# Patient Record
Sex: Female | Born: 1980
Health system: Southern US, Community
[De-identification: ages and names within clinical notes are randomized; demographics above are authoritative.]

## PROBLEM LIST (undated history)

## (undated) ENCOUNTER — Inpatient Hospital Stay (HOSPITAL_COMMUNITY): Payer: Self-pay

## (undated) DIAGNOSIS — R51 Headache: Secondary | ICD-10-CM

## (undated) DIAGNOSIS — Z8619 Personal history of other infectious and parasitic diseases: Secondary | ICD-10-CM

## (undated) DIAGNOSIS — Z9119 Patient's noncompliance with other medical treatment and regimen: Secondary | ICD-10-CM

## (undated) DIAGNOSIS — O09899 Supervision of other high risk pregnancies, unspecified trimester: Secondary | ICD-10-CM

## (undated) HISTORY — DX: Personal history of other infectious and parasitic diseases: Z86.19

## (undated) HISTORY — PX: COLPOSCOPY: SHX161

## (undated) HISTORY — PX: BREAST SURGERY: SHX581

## (undated) HISTORY — PX: EYE SURGERY: SHX253

## (undated) HISTORY — DX: Headache: R51

## (undated) HISTORY — PX: AUGMENTATION MAMMAPLASTY: SUR837

---

## 2007-09-23 ENCOUNTER — Emergency Department (HOSPITAL_COMMUNITY): Admission: EM | Admit: 2007-09-23 | Discharge: 2007-09-23 | Payer: Self-pay | Admitting: Family Medicine

## 2007-10-29 ENCOUNTER — Other Ambulatory Visit: Admission: RE | Admit: 2007-10-29 | Discharge: 2007-10-29 | Payer: Self-pay | Admitting: Family Medicine

## 2008-06-07 ENCOUNTER — Emergency Department (HOSPITAL_COMMUNITY): Admission: EM | Admit: 2008-06-07 | Discharge: 2008-06-07 | Payer: Self-pay | Admitting: Family Medicine

## 2008-11-12 ENCOUNTER — Other Ambulatory Visit: Admission: RE | Admit: 2008-11-12 | Discharge: 2008-11-12 | Payer: Self-pay | Admitting: Family Medicine

## 2009-06-20 ENCOUNTER — Emergency Department (HOSPITAL_COMMUNITY): Admission: EM | Admit: 2009-06-20 | Discharge: 2009-06-20 | Payer: Self-pay | Admitting: Family Medicine

## 2009-11-18 ENCOUNTER — Other Ambulatory Visit: Admission: RE | Admit: 2009-11-18 | Discharge: 2009-11-18 | Payer: Self-pay | Admitting: Family Medicine

## 2010-02-11 ENCOUNTER — Emergency Department (HOSPITAL_COMMUNITY): Admission: EM | Admit: 2010-02-11 | Discharge: 2010-02-11 | Payer: Self-pay | Admitting: Emergency Medicine

## 2010-08-08 NOTE — L&D Delivery Note (Signed)
Delivery Note Pt progressed rapidly to complete dilation and pushed 3 times.  At 12:34 PM a healthy female was delivered via  (Presentation: LOA  ).  APGAR: 9, 9; weight 6 lb 9.3 oz (2985 g).   Placenta status: delivered spontaneously.  No issues.  Anesthesia:  Epidural Episiotomy: n/a Lacerations: small second degree Suture Repair: 3.0 vicryl rapide Est. Blood Loss (mL): 300cc  Mom to postpartum.  Baby to nursery-stable  BP remains WNL at 119/80.Marland Kitchen  Oliver Pila 07/26/2011, 12:54 PM

## 2011-01-19 LAB — ANTIBODY SCREEN: Antibody Screen: NEGATIVE

## 2011-01-19 LAB — HIV ANTIBODY (ROUTINE TESTING W REFLEX): HIV: NONREACTIVE

## 2011-01-19 LAB — GC/CHLAMYDIA PROBE AMP, GENITAL
Chlamydia: NEGATIVE
Gonorrhea: NEGATIVE

## 2011-01-19 LAB — RUBELLA ANTIBODY, IGM: Rubella: IMMUNE

## 2011-01-19 LAB — RPR: RPR: NONREACTIVE

## 2011-07-02 ENCOUNTER — Emergency Department
Admission: EM | Admit: 2011-07-02 | Discharge: 2011-07-02 | Disposition: A | Discharge status: Home / Self Care | Payer: 59 | Source: Home / Self Care | Attending: Family Medicine | Admitting: Family Medicine

## 2011-07-02 DIAGNOSIS — J111 Influenza due to unidentified influenza virus with other respiratory manifestations: Secondary | ICD-10-CM

## 2011-07-02 DIAGNOSIS — J101 Influenza due to other identified influenza virus with other respiratory manifestations: Secondary | ICD-10-CM

## 2011-07-02 HISTORY — DX: Patient's noncompliance with other medical treatment and regimen: Z91.19

## 2011-07-02 HISTORY — DX: Supervision of other high risk pregnancies, unspecified trimester: O09.899

## 2011-07-02 MED ORDER — OSELTAMIVIR PHOSPHATE 75 MG PO CAPS: 75.0000 mg | ORAL_CAPSULE | Freq: Two times a day (BID) | ORAL | Status: AC

## 2011-07-02 NOTE — ED Notes (Signed)
States symptoms started yesterday w/out fever- dry cough- headache, bodyaches and sore throat,

## 2011-07-05 NOTE — ED Provider Notes (Signed)
History     CSN: [id number] Arrival date & time: 07/02/2011 11:14 AM   First MD Initiated Contact with Patient 07/02/11 1141      Chief Complaint  Patient presents with  . URI     HPI Comments: Patient is [redacted] weeks pregnant and yesterday developed relatively mild flu-like symptoms including sore throat, a non-productive cough, myalgias, headache, nasal congestion, and today had chills.  She has had a flu shot.  No abdominal/pelvic pain or vaginal bleeding.  Patient is a 30 y.o. female presenting with URI. The history is provided by the patient and the spouse.  URI The primary symptoms include fatigue, headaches, sore throat, cough and myalgias. Primary symptoms do not include fever, ear pain, swollen glands, wheezing, abdominal pain, nausea, vomiting, arthralgias or rash. The current episode started yesterday. This is a new problem.  Symptoms associated with the illness include chills and congestion. The illness is not associated with plugged ear sensation, facial pain, sinus pressure or rhinorrhea.    Past Medical History  Diagnosis Date  . Pregnant patient     35wk    History reviewed. No pertinent past surgical history.  History reviewed. No pertinent family history.  History  Substance Use Topics  . Smoking status: Never Smoker   . Smokeless tobacco: Not on file  . Alcohol Use: No    OB History    Grav Para Term Preterm Abortions TAB SAB Ect Mult Living   1               Review of Systems  Constitutional: Positive for chills and fatigue. Negative for fever.  HENT: Positive for congestion and sore throat. Negative for ear pain, rhinorrhea, sinus pressure and ear discharge.   Eyes: Negative.   Respiratory: Positive for cough. Negative for shortness of breath and wheezing.   Cardiovascular: Negative.   Gastrointestinal: Negative.  Negative for nausea, vomiting and abdominal pain.  Genitourinary: Negative.   Musculoskeletal: Positive for myalgias. Negative for  arthralgias.  Skin: Negative.  Negative for rash.  Neurological: Positive for headaches.    Allergies  Review of patient's allergies indicates no known allergies.  Home Medications   Current Outpatient Rx  Name Route Sig Dispense Refill  . OMEGA-3 FATTY ACIDS 1000 MG PO CAPS Oral Take 2 g by mouth daily.      Marland Kitchen PRENATE ELITE 90-600-400 MG-MCG-MCG PO TABS Oral Take 1 tablet by mouth daily.      . OSELTAMIVIR PHOSPHATE 75 MG PO CAPS Oral Take 1 capsule (75 mg total) by mouth every 12 (twelve) hours. 10 capsule 0    BP 144/92  Pulse 95  Temp(Src) 98.5 F (36.9 C) (Oral)  Resp 18  Ht 5\' 2"  (1.575 m)  Wt 143 lb (64.864 kg)  BMI 26.15 kg/m2  SpO2 98%  Physical Exam  Constitutional: She is oriented to person, place, and time. She appears well-developed and well-nourished. No distress.  HENT:  Head: Normocephalic.  Right Ear: External ear normal.  Left Ear: External ear normal.  Nose: Mucosal edema and rhinorrhea present. No sinus tenderness.  Mouth/Throat: Oropharynx is clear and moist. No oropharyngeal exudate.  Eyes: Conjunctivae and EOM are normal. Pupils are equal, round, and reactive to light. Right eye exhibits no discharge. Left eye exhibits no discharge.  Neck: Normal range of motion. Neck supple.  Cardiovascular: Normal rate, regular rhythm and normal heart sounds.   Pulmonary/Chest: Effort normal and breath sounds normal. No respiratory distress. She has no wheezes. She has no  rales. She exhibits tenderness.       Chest:  There is mild tenderness to palpation over the mid-sternum.   Abdominal: Soft. Bowel sounds are normal. There is no tenderness.       Abdomen is gravid.  Musculoskeletal: She exhibits no edema and no tenderness.  Lymphadenopathy:    She has cervical adenopathy.       Right cervical: Posterior cervical adenopathy present.       Left cervical: Posterior cervical adenopathy present.  Neurological: She is alert and oriented to person, place, and time.    Skin: Skin is warm and dry. She is not diaphoretic.    ED Course  Procedures none   Labs Reviewed  POCT INFLUENZA A/B positive influenza A      1. Influenza A       MDM  Because of significant morbidity and mortality from influenza in pregnant women, patient and husband prefer to begin a course of Tamiflu.  Although Tamiflu is a category C drug for use in pregnancy, there have been no reports of adverse effects during pregnancy. Rest, increase fluid intake Followup with OB as soon as possible. Return (or proceed to ER) for worsening symptoms:  shortness of breath, increasing abdominal pain, etc.     Donna Christen, MD 07/05/11 [phone]

## 2011-07-22 ENCOUNTER — Encounter (HOSPITAL_COMMUNITY): Payer: Self-pay | Admitting: *Deleted

## 2011-07-22 ENCOUNTER — Telehealth (HOSPITAL_COMMUNITY): Payer: Self-pay | Admitting: *Deleted

## 2011-07-22 NOTE — Telephone Encounter (Signed)
Preadmission screen  

## 2011-07-25 ENCOUNTER — Encounter (HOSPITAL_COMMUNITY): Payer: Self-pay | Admitting: *Deleted

## 2011-07-25 ENCOUNTER — Other Ambulatory Visit: Payer: Self-pay | Admitting: Obstetrics and Gynecology

## 2011-07-25 ENCOUNTER — Telehealth (HOSPITAL_COMMUNITY): Payer: Self-pay | Admitting: *Deleted

## 2011-07-25 NOTE — Telephone Encounter (Signed)
Preadmission screen  

## 2011-07-26 ENCOUNTER — Inpatient Hospital Stay (HOSPITAL_COMMUNITY): Payer: Commercial Managed Care - PPO | Admitting: Anesthesiology

## 2011-07-26 ENCOUNTER — Encounter (HOSPITAL_COMMUNITY): Payer: Self-pay | Admitting: Anesthesiology

## 2011-07-26 ENCOUNTER — Encounter (HOSPITAL_COMMUNITY): Payer: Self-pay

## 2011-07-26 ENCOUNTER — Inpatient Hospital Stay (HOSPITAL_COMMUNITY)
Admission: RE | Admit: 2011-07-26 | Discharge: 2011-07-28 | DRG: 775 | Disposition: A | Discharge status: Home / Self Care | Payer: Commercial Managed Care - PPO | Source: Ambulatory Visit | Attending: Obstetrics and Gynecology | Admitting: Obstetrics and Gynecology

## 2011-07-26 DIAGNOSIS — O26839 Pregnancy related renal disease, unspecified trimester: Principal | ICD-10-CM | POA: Diagnosis present

## 2011-07-26 LAB — COMPREHENSIVE METABOLIC PANEL
ALT: 10 U/L (ref 0–35)
Calcium: 9.4 mg/dL (ref 8.4–10.5)
Creatinine, Ser: 0.69 mg/dL (ref 0.50–1.10)
GFR calc Af Amer: >90 mL/min (ref >90)
Glucose, Bld: 102 mg/dL — ABNORMAL HIGH (ref 70–99)
Sodium: 135 mEq/L (ref 135–145)
Total Protein: 6.3 g/dL (ref 6.0–8.3)

## 2011-07-26 LAB — ABO/RH: ABO/RH(D): O POS

## 2011-07-26 LAB — CBC
Hemoglobin: 12 g/dL (ref 12.0–15.0)
MCH: 29.8 pg (ref 26.0–34.0)
MCHC: 32.7 g/dL (ref 30.0–36.0)
MCV: 91.1 fL (ref 78.0–100.0)

## 2011-07-26 LAB — RPR: RPR Ser Ql: NONREACTIVE

## 2011-07-26 LAB — URIC ACID: Uric Acid, Serum: 5.4 mg/dL (ref 2.4–7.0)

## 2011-07-26 MED ORDER — FLEET ENEMA 7-19 GM/118ML RE ENEM: 1.0000 | ENEMA | RECTAL | Status: DC | PRN

## 2011-07-26 MED ORDER — SIMETHICONE 80 MG PO CHEW: 80.0000 mg | CHEWABLE_TABLET | ORAL | Status: DC | PRN

## 2011-07-26 MED ORDER — ACETAMINOPHEN 325 MG PO TABS: 650.0000 mg | ORAL_TABLET | ORAL | Status: DC | PRN

## 2011-07-26 MED ORDER — BENZOCAINE-MENTHOL 20-0.5 % EX AERO
1.0000 "application " | INHALATION_SPRAY | CUTANEOUS | Status: DC | PRN
  Administered 2011-07-26: 1 via TOPICAL

## 2011-07-26 MED ORDER — LACTATED RINGERS IV SOLN: 500.0000 mL | INTRAVENOUS | Status: DC | PRN

## 2011-07-26 MED ORDER — CITRIC ACID-SODIUM CITRATE 334-500 MG/5ML PO SOLN: 30.0000 mL | ORAL | Status: DC | PRN

## 2011-07-26 MED ORDER — LIDOCAINE HCL 1.5 % IJ SOLN
INTRAMUSCULAR | Status: DC | PRN
  Administered 2011-07-26: 5 mL via INTRADERMAL
  Administered 2011-07-26: 2 mL via INTRADERMAL
  Administered 2011-07-26: 5 mL via INTRADERMAL

## 2011-07-26 MED ORDER — PRENATAL MULTIVITAMIN CH
1.0000 | ORAL_TABLET | Freq: Every day | ORAL | Status: DC
  Administered 2011-07-27 – 2011-07-28 (×2): 1 via ORAL
  Filled 2011-07-26 (×2): qty 1

## 2011-07-26 MED ORDER — DIBUCAINE 1 % RE OINT: 1.0000 "application " | TOPICAL_OINTMENT | RECTAL | Status: DC | PRN

## 2011-07-26 MED ORDER — OXYTOCIN 20 UNITS IN LACTATED RINGERS INFUSION - SIMPLE
125.0000 mL/h | Freq: Once | INTRAVENOUS | Status: AC
  Administered 2011-07-26: 125 mL/h via INTRAVENOUS

## 2011-07-26 MED ORDER — LACTATED RINGERS IV SOLN
500.0000 mL | Freq: Once | INTRAVENOUS | Status: AC
  Administered 2011-07-26: 1000 mL via INTRAVENOUS

## 2011-07-26 MED ORDER — LANOLIN HYDROUS EX OINT: TOPICAL_OINTMENT | CUTANEOUS | Status: DC | PRN

## 2011-07-26 MED ORDER — BENZOCAINE-MENTHOL 20-0.5 % EX AERO
INHALATION_SPRAY | CUTANEOUS | Status: AC
  Filled 2011-07-26: qty 56

## 2011-07-26 MED ORDER — PHENYLEPHRINE 40 MCG/ML (10ML) SYRINGE FOR IV PUSH (FOR BLOOD PRESSURE SUPPORT): 80.0000 ug | PREFILLED_SYRINGE | INTRAVENOUS | Status: DC | PRN

## 2011-07-26 MED ORDER — IBUPROFEN 600 MG PO TABS
600.0000 mg | ORAL_TABLET | Freq: Four times a day (QID) | ORAL | Status: DC | PRN
  Administered 2011-07-26: 600 mg via ORAL
  Filled 2011-07-26: qty 1

## 2011-07-26 MED ORDER — DIPHENHYDRAMINE HCL 25 MG PO CAPS: 25.0000 mg | ORAL_CAPSULE | Freq: Four times a day (QID) | ORAL | Status: DC | PRN

## 2011-07-26 MED ORDER — ONDANSETRON HCL 4 MG/2ML IJ SOLN: 4.0000 mg | Freq: Four times a day (QID) | INTRAMUSCULAR | Status: DC | PRN

## 2011-07-26 MED ORDER — DIPHENHYDRAMINE HCL 50 MG/ML IJ SOLN: 12.5000 mg | INTRAMUSCULAR | Status: DC | PRN

## 2011-07-26 MED ORDER — ONDANSETRON HCL 4 MG PO TABS: 4.0000 mg | ORAL_TABLET | ORAL | Status: DC | PRN

## 2011-07-26 MED ORDER — OXYCODONE-ACETAMINOPHEN 5-325 MG PO TABS
1.0000 | ORAL_TABLET | ORAL | Status: DC | PRN
  Administered 2011-07-27 (×2): 1 via ORAL
  Filled 2011-07-26 (×2): qty 1

## 2011-07-26 MED ORDER — LIDOCAINE HCL (PF) 1 % IJ SOLN
30.0000 mL | INTRAMUSCULAR | Status: DC | PRN
  Filled 2011-07-26: qty 30

## 2011-07-26 MED ORDER — ONDANSETRON HCL 4 MG/2ML IJ SOLN
4.0000 mg | INTRAMUSCULAR | Status: DC | PRN
  Filled 2011-07-26: qty 2

## 2011-07-26 MED ORDER — OXYTOCIN BOLUS FROM INFUSION
500.0000 mL | Freq: Once | INTRAVENOUS | Status: DC
  Filled 2011-07-26: qty 500

## 2011-07-26 MED ORDER — FENTANYL 2.5 MCG/ML BUPIVACAINE 1/10 % EPIDURAL INFUSION (WH - ANES)
14.0000 mL/h | INTRAMUSCULAR | Status: DC
  Administered 2011-07-26: 14 mL/h via EPIDURAL
  Filled 2011-07-26: qty 60

## 2011-07-26 MED ORDER — SENNOSIDES-DOCUSATE SODIUM 8.6-50 MG PO TABS
2.0000 | ORAL_TABLET | Freq: Every day | ORAL | Status: DC
  Administered 2011-07-27 (×2): 2 via ORAL

## 2011-07-26 MED ORDER — LACTATED RINGERS IV SOLN
INTRAVENOUS | Status: DC
  Administered 2011-07-26: 08:00:00 via INTRAVENOUS

## 2011-07-26 MED ORDER — IBUPROFEN 600 MG PO TABS
600.0000 mg | ORAL_TABLET | Freq: Four times a day (QID) | ORAL | Status: DC
  Administered 2011-07-26 – 2011-07-28 (×7): 600 mg via ORAL
  Filled 2011-07-26 (×7): qty 1

## 2011-07-26 MED ORDER — OXYTOCIN 20 UNITS IN LACTATED RINGERS INFUSION - SIMPLE
1.0000 m[IU]/min | INTRAVENOUS | Status: DC
  Administered 2011-07-26: 2 m[IU]/min via INTRAVENOUS
  Filled 2011-07-26: qty 1000

## 2011-07-26 MED ORDER — EPHEDRINE 5 MG/ML INJ: 10.0000 mg | INTRAVENOUS | Status: DC | PRN

## 2011-07-26 MED ORDER — ZOLPIDEM TARTRATE 5 MG PO TABS: 5.0000 mg | ORAL_TABLET | Freq: Every evening | ORAL | Status: DC | PRN

## 2011-07-26 MED ORDER — WITCH HAZEL-GLYCERIN EX PADS: 1.0000 "application " | MEDICATED_PAD | CUTANEOUS | Status: DC | PRN

## 2011-07-26 MED ORDER — TERBUTALINE SULFATE 1 MG/ML IJ SOLN
0.2500 mg | Freq: Once | INTRAMUSCULAR | Status: DC | PRN
Start: 2011-07-26 — End: 2011-07-26

## 2011-07-26 MED ORDER — EPHEDRINE 5 MG/ML INJ
10.0000 mg | INTRAVENOUS | Status: DC | PRN
  Filled 2011-07-26: qty 4

## 2011-07-26 MED ORDER — PHENYLEPHRINE 40 MCG/ML (10ML) SYRINGE FOR IV PUSH (FOR BLOOD PRESSURE SUPPORT)
80.0000 ug | PREFILLED_SYRINGE | INTRAVENOUS | Status: DC | PRN
  Filled 2011-07-26: qty 5

## 2011-07-26 MED ORDER — TETANUS-DIPHTH-ACELL PERTUSSIS 5-2.5-18.5 LF-MCG/0.5 IM SUSP: 0.5000 mL | Freq: Once | INTRAMUSCULAR | Status: DC

## 2011-07-26 MED ORDER — OXYCODONE-ACETAMINOPHEN 5-325 MG PO TABS: 2.0000 | ORAL_TABLET | ORAL | Status: DC | PRN

## 2011-07-26 NOTE — Anesthesia Preprocedure Evaluation (Signed)
Anesthesia Evaluation  Patient identified by MRN, date of birth, ID band Patient awake    Reviewed: Allergy & Precautions, H&P , NPO status , Patient's Chart, lab work & pertinent test results, reviewed documented beta blocker date and time   History of Anesthesia Complications Negative for: history of anesthetic complications  Airway Mallampati: I TM Distance: >3 FB Neck ROM: full    Dental  (+) Teeth Intact   Pulmonary neg pulmonary ROS,  clear to auscultation        Cardiovascular neg cardio ROS regular Normal    Neuro/Psych  Headaches (migraines - last 1 week ago), Negative Psych ROS   GI/Hepatic negative GI ROS, Neg liver ROS,   Endo/Other  Negative Endocrine ROS  Renal/GU negative Renal ROS  Genitourinary negative   Musculoskeletal   Abdominal   Peds  Hematology negative hematology ROS (+)   Anesthesia Other Findings   Reproductive/Obstetrics (+) Pregnancy                           Anesthesia Physical Anesthesia Plan  ASA: II  Anesthesia Plan: Epidural   Post-op Pain Management:    Induction:   Airway Management Planned:   Additional Equipment:   Intra-op Plan:   Post-operative Plan:   Informed Consent: I have reviewed the patients History and Physical, chart, labs and discussed the procedure including the risks, benefits and alternatives for the proposed anesthesia with the patient or authorized representative who has indicated his/her understanding and acceptance.     Plan Discussed with:   Anesthesia Plan Comments:         Anesthesia Quick Evaluation

## 2011-07-26 NOTE — H&P (Addendum)
Kylie Lee is a 30 y.o. female G2P0010 at 38+ weeks gestation (EDD 08/12/11 by LMP c/w [place] at 6 weeks) presenting for induction given increasing proteinuria and some borderline BP of 120-140/80-90.  Pt first developed significant proteinuria at 32 weks with a 24 hour urine showing greater than 1000mg /24 hours.  At that point her BP and PIH labs were completely WNL, so seemed to be neprotic syndrome of unknown etiology.  She has been followed extremely closely wqith NST's, labs and a repeat 24 hour urine last week showed proteinuria increased to about 1900mg .  She remains asymptomatic but some BP are 130/90 and given the difficulty determining if preeclampsia vs her nephrotic syndrome, we are delivering now that she is term.  Prenatal care has been otherwise uneventful prior to the proteinuria.  History OB History    Grav Para Term Preterm Abortions TAB SAB Ect Mult Living   2 0 0  1  1       SAB x 1   2011  PMH Proteinuria with pregnancy  Past Surgical History  Procedure Date  . Eye surgery     lasik  . Breast surgery     aug   Family History: family history includes Arthritis in her paternal grandmother; Cancer in her paternal grandfather; Hypothyroidism in her father; Migraines in her mother; and Thyroid disease in her father and maternal grandmother. Social History:  reports that she has never smoked. She has never used smokeless tobacco. She reports that she does not drink alcohol or use illicit drugs.  Review of Systems  Eyes: Negative for blurred vision.  Neurological: Negative for headaches.    Dilation: 3.5 Effacement (%): 80 Station: -2 Exam by:: Dr Senaida Ores AROM clear  Blood pressure 140/81, pulse 74, temperature 97.5 F (36.4 C), temperature source Oral, resp. rate 18, height 5\' 2"  (1.575 m), weight 64.864 kg (143 lb), last menstrual period 11/01/2010. Maternal Exam:  Uterine Assessment: Contraction strength is mild.  Contraction frequency is irregular.    Abdomen: Fetal presentation: vertex  Introitus: Normal vulva. Normal vagina.    Fetal Exam Fetal Monitor Review: Variability: moderate (6-25 bpm).   Pattern: accelerations present.    Fetal State Assessment: Category I - tracings are normal.     Physical Exam  Constitutional: She is oriented to person, place, and time. She appears well-developed and well-nourished.  Cardiovascular: Normal rate and regular rhythm.   Respiratory: Effort normal and breath sounds normal.  GI: Soft. Bowel sounds are normal.  Genitourinary: Vagina normal and uterus normal.  Neurological: She is alert and oriented to person, place, and time.  Psychiatric: She has a normal mood and affect. Her behavior is normal.    Prenatal labs: ABO, Rh: O/Positive/-- (06/13 0000) Antibody: Negative (06/13 0000) Rubella: Immune (06/13 0000) RPR: Nonreactive (06/13 0000)  HBsAg: Negative (06/13 0000)  HIV: Non-reactive (06/13 0000)  GBS: Negative (11/30 0000)  One hour glucola 115 Declined genetics   Assessment/Plan: Pt extremely favorable, plans epidural.  On pitocin. PIH labs remain WNL and BP acceptable at 140/80's.  Do not plan magnesium unless BP worsen.  Oliver Pila 07/26/2011, 9:06 AM

## 2011-07-26 NOTE — Anesthesia Procedure Notes (Signed)
Epidural Patient location during procedure: OB Start time: 07/26/2011 9:16 AM Reason for block: procedure for pain  Staffing Performed by: anesthesiologist   Preanesthetic Checklist Completed: patient identified, site marked, surgical consent, pre-op evaluation, timeout performed, IV checked, risks and benefits discussed and monitors and equipment checked  Epidural Patient position: sitting Prep: site prepped and draped and DuraPrep Patient monitoring: continuous pulse ox and blood pressure Approach: midline Injection technique: LOR air  Needle:  Needle type: Tuohy  Needle gauge: 17 G Needle length: 9 cm Catheter type: closed end flexible Catheter size: 19 Gauge Test dose: negative  Assessment Events: blood not aspirated, injection not painful, no injection resistance, negative IV test and no paresthesia  Additional Notes Discussed risk of headache, infection, bleeding, nerve injury and failed or incomplete block.  Patient voices understanding and wishes to proceed.

## 2011-07-26 NOTE — Progress Notes (Signed)
Delivery of live viable female by Dr Senaida Ores

## 2011-07-27 LAB — CBC
MCH: 29.6 pg (ref 26.0–34.0)
MCHC: 32.8 g/dL (ref 30.0–36.0)
Platelets: 188 10*3/uL (ref 150–400)
RBC: 3.04 MIL/uL — ABNORMAL LOW (ref 3.87–5.11)
RDW: 13.7 % (ref 11.5–15.5)

## 2011-07-27 NOTE — Progress Notes (Addendum)
Post Partum Day 1 Subjective: no complaints, up ad lib and tolerating PO  Objective: Blood pressure 107/73, pulse 76, temperature 97.9 F (36.6 C), temperature source Oral, resp. rate 18, height 5\' 2"  (1.575 m), weight 64.864 kg (143 lb), last menstrual period 11/01/2010, unknown if currently breastfeeding.  Physical Exam:  General: alert Lochia: appropriate Uterine Fundus: firm    Basename 07/27/11 0515 07/26/11 0730  HGB 9.0* 12.0  HCT 27.4* 36.7    Assessment/Plan: Plan for discharge tomorrow Circumcisiond/w pt in detail and she desires to proceed   LOS: 1 day   Breniyah Romm W 07/27/2011, 10:14 AM

## 2011-07-27 NOTE — Anesthesia Postprocedure Evaluation (Signed)
  Anesthesia Post-op Note  Patient: Kylie Lee  Procedure(s) Performed: * No procedures listed *  Patient Location: [facility]  Anesthesia Type: Epidural  Level of Consciousness: alert  and oriented  Airway and Oxygen Therapy: Patient Spontanous Breathing  Post-op Pain: mild  Post-op Assessment: Patient's Cardiovascular Status Stable and Respiratory Function Stable  Post-op Vital Signs: stable  Complications: No apparent anesthesia complications

## 2011-07-28 MED ORDER — IBUPROFEN 600 MG PO TABS: 600.0000 mg | ORAL_TABLET | Freq: Four times a day (QID) | ORAL | Status: AC

## 2011-07-28 NOTE — Progress Notes (Signed)
Post Partum Day 2 Subjective: no complaints, up ad lib and tolerating PO  Objective: Blood pressure 121/78, pulse 97, temperature 98.6 F (37 C), temperature source Oral, resp. rate 18, height 5\' 2"  (1.575 m), weight 64.864 kg (143 lb), last menstrual period 11/01/2010, unknown if currently breastfeeding.  Physical Exam:  General: alert Lochia: appropriate Uterine Fundus: firm   Basename 07/27/11 0515 07/26/11 0730  HGB 9.0* 12.0  HCT 27.4* 36.7    Assessment/Plan: Discharge home motrin   LOS: 2 days   Kylie Lee W 07/28/2011, 9:11 AM

## 2011-07-28 NOTE — Discharge Summary (Signed)
Obstetric Discharge Summary Reason for Admission: induction of labor Prenatal Procedures: none Intrapartum Procedures: spontaneous vaginal delivery Postpartum Procedures: none Complications-Operative and Postpartum: first degree perineal laceration Hemoglobin  Date Value Range Status  07/27/2011 9.0* 12.0-15.0 (g/dL) Final     DELTA CHECK NOTED     REPEATED TO VERIFY     HCT  Date Value Range Status  07/27/2011 27.4* 36.0-46.0 (%) Final    Discharge Diagnoses: Term Pregnancy-delivered  Discharge Information: Date: 07/28/2011 Activity: pelvic rest Diet: routine Medications: Ibuprofen Condition: improved Instructions: refer to practice specific booklet Discharge to: home   Newborn Data: Live born female  Birth Weight: 6 lb 9.3 oz (2985 g) APGAR: 9, 9  Home with mother.  Kylie Lee 07/28/2011, 9:13 AM

## 2011-08-12 ENCOUNTER — Inpatient Hospital Stay (HOSPITAL_COMMUNITY): Admission: AD | Admit: 2011-08-12 | Payer: Self-pay | Source: Ambulatory Visit | Admitting: Obstetrics and Gynecology

## 2011-10-14 ENCOUNTER — Ambulatory Visit (HOSPITAL_COMMUNITY)
Admission: RE | Admit: 2011-10-14 | Discharge: 2011-10-14 | Disposition: A | Discharge status: Home / Self Care | Payer: Commercial Managed Care - PPO | Source: Ambulatory Visit | Attending: Obstetrics and Gynecology | Admitting: Obstetrics and Gynecology

## 2011-10-14 NOTE — Progress Notes (Signed)
Adult Lactation Consultation Outpatient Visit Note  Patient Name: Kylie Lee Date of Birth: 1980-09-07  Kenneshia made an appointment today because of her 54 week old infant having a fussy evening, and night last night and not wanting to nurse.  Dylan had his frenulum clipped yesterday, primarily due to constant sore, and pinched nipples while nursing.  Baby has gained well since birth, he is 13 lbs 15.9 oz today, solely breast feeding, in spite of the painful nursing.  Symia has an abundant milk supply.  Baby has had 3 good breast feedings since she made the appointment, but she decided to keep it anyway.  Watched her try to feed Dylan in cross cradle hold, but he wasn't hungry as he was fed an hour ago.  Both nipples pink, no blistering, no cracking noted (denied stinging or burning) He has never opened up widely, and Marisa pushes her nipple into his mouth.  Initiated a nipple shield to see if this would help force Dylan's mouth open wider onto areola.  Baby was not hungry.  Mom to take NS with her and try.  Also suggested she try Dr. Allene Pyo All Purpose Nipple Ointment to help with the soreness.  We also discussed that Domingo Dimes is still trying out his new extendable tongue since it was clipped 24 hrs ago.   Impression is that baby had a rough night due to frenotomy stress, ? Discomfort in his mouth, or possible picking up on Mom's stress as she is sad about returning to full time work in 4 days.  We talked about this.  I recommended she rest as much as she can and nurse baby often, as she has been doing.  She will try the [facility], and possibly the nipple shield to facilitate Dylan's mouth opening wider when latching to the breast.     Follow-Up- PRN      Judee Clara 10/14/2011, 3:35 PM

## 2013-01-16 LAB — OB RESULTS CONSOLE ANTIBODY SCREEN: Antibody Screen: NEGATIVE

## 2013-01-16 LAB — OB RESULTS CONSOLE RUBELLA ANTIBODY, IGM: Rubella: IMMUNE

## 2013-01-16 LAB — OB RESULTS CONSOLE ABO/RH: RH Type: POSITIVE

## 2013-07-11 LAB — OB RESULTS CONSOLE GBS: GBS: NEGATIVE

## 2013-07-15 ENCOUNTER — Other Ambulatory Visit: Payer: Self-pay | Admitting: Dermatology

## 2013-07-19 ENCOUNTER — Telehealth (HOSPITAL_COMMUNITY): Payer: Self-pay | Admitting: *Deleted

## 2013-07-19 ENCOUNTER — Encounter (HOSPITAL_COMMUNITY): Payer: Self-pay | Admitting: *Deleted

## 2013-07-19 NOTE — Telephone Encounter (Signed)
Preadmission screen  

## 2013-07-22 MED ORDER — TERBUTALINE SULFATE 1 MG/ML IJ SOLN: 0.2500 mg | Freq: Once | INTRAMUSCULAR | Status: DC

## 2013-07-22 NOTE — H&P (Signed)
Kylie Lee is a 32 y.o. female G3P1011 at 44 0/7 weeks (EDD 08/13/13 by LMP c/w 8 week [place]) presenting for atttempted version of a breech presentation.  Prenatal care complicated by proteinuria.  She had a similar course with her last pregnancy, with urine protein greater than 1000mg , but normal BP and no preeclampsia.  Resolved after delivery.  Similarly, she has normal labs and BP with isolated proteinuria.  24 hour protein 07/08/13 was 1105mg .  No other issues with this pregancy except for the breech presentation.  Maternal Medical History:  Contractions: Frequency: rare.   Perceived severity is mild.    Fetal activity: Perceived fetal activity is normal.    Prenatal Complications - Diabetes: none.    OB History   Grav Para Term Preterm Abortions TAB SAB Ect Mult Living   3 1 1  1  1   1     NSVD x 1  Past Medical History  Diagnosis Date  . Noncompliant pregnant patient     35wk  . History of HPV infection   . Headache(784.0)     migraine  . Hx of varicella    Past Surgical History  Procedure Laterality Date  . Eye surgery      lasik  . Breast surgery      aug  . Colposcopy     Family History: family history includes Arthritis in her paternal grandmother; Cancer in her paternal grandfather; Hypothyroidism in her father; Migraines in her mother; Thyroid disease in her father and maternal grandmother. Social History:  reports that she has never smoked. She has never used smokeless tobacco. She reports that she does not drink alcohol or use illicit drugs.   Prenatal Transfer Tool  Maternal Diabetes: No Genetic Screening: Declined Maternal Ultrasounds/Referrals: Normal Fetal Ultrasounds or other Referrals:  None Maternal Substance Abuse:  No Significant Maternal Medications:  None Significant Maternal Lab Results:  None Other Comments:  None  Review of Systems  Neurological: Negative for headaches.      Last menstrual period 11/06/2012, unknown if currently  breastfeeding. Maternal Exam:  Uterine Assessment: Contraction strength is mild.  Contraction frequency is rare.   Abdomen: Patient reports no abdominal tenderness. Fetal presentation: breech  Introitus: Normal vulva. Normal vagina.    Physical Exam  Constitutional: She is oriented to person, place, and time. She appears well-developed and well-nourished.  Cardiovascular: Normal rate and regular rhythm.   Respiratory: Effort normal and breath sounds normal.  GI: Soft.  Genitourinary: Vagina normal and uterus normal.  Neurological: She is alert and oriented to person, place, and time.  Psychiatric: She has a normal mood and affect.    Prenatal labs: ABO, Rh: O/Positive/-- (06/11 0000) Antibody: Negative (06/11 0000) Rubella: Immune (06/11 0000) RPR: Nonreactive (06/11 0000)  HBsAg: Negative (06/11 0000)  HIV: Non-reactive (06/11 0000)  GBS: Negative (12/04 0000)  One hour GTT 110  Assessment/Plan: Pt for attempted version of breech presentation.  All risks and benefits of procedure reviewed with patient including need for emergent c-section in event of fetal distress.  Pt desires to proceed.  Oliver Pila 07/22/2013, 6:09 PM

## 2013-07-23 ENCOUNTER — Encounter (HOSPITAL_COMMUNITY): Payer: Self-pay

## 2013-07-23 ENCOUNTER — Observation Stay (HOSPITAL_COMMUNITY)
Admission: RE | Admit: 2013-07-23 | Discharge: 2013-07-23 | Disposition: A | Discharge status: Home / Self Care | Payer: Commercial Managed Care - PPO | Source: Ambulatory Visit | Attending: Obstetrics and Gynecology | Admitting: Obstetrics and Gynecology

## 2013-07-23 VITALS — BP 122/79 | HR 110 | Temp 97.8°F | Resp 20 | Ht 62.0 in | Wt 151.0 lb

## 2013-07-23 DIAGNOSIS — O321XX0 Maternal care for breech presentation, not applicable or unspecified: Principal | ICD-10-CM | POA: Insufficient documentation

## 2013-07-23 DIAGNOSIS — O321XX1 Maternal care for breech presentation, fetus 1: Secondary | ICD-10-CM

## 2013-07-23 LAB — CBC
HCT: 36.5 % (ref 36.0–46.0)
Hemoglobin: 12.1 g/dL (ref 12.0–15.0)
MCH: 28.8 pg (ref 26.0–34.0)
MCHC: 33.2 g/dL (ref 30.0–36.0)
Platelets: 275 10*3/uL (ref 150–400)
RDW: 13.1 % (ref 11.5–15.5)

## 2013-07-23 MED ORDER — LACTATED RINGERS IV SOLN
INTRAVENOUS | Status: DC
  Administered 2013-07-23: 08:00:00 via INTRAVENOUS

## 2013-07-23 MED ORDER — TERBUTALINE SULFATE 1 MG/ML IJ SOLN
0.2500 mg | Freq: Once | INTRAMUSCULAR | Status: AC
  Administered 2013-07-23: 0.25 mg via SUBCUTANEOUS
  Filled 2013-07-23: qty 1

## 2013-07-23 NOTE — Discharge Summary (Signed)
Admitting diagnosis Breech presentation  Discharge Diagnosis Breech Presentation  Hospital summary Attempted cephalic version x 3 and unsuccessful.  FHR stable throughout attempt and in post-attempt monitoring Will schedule c-section in 2 weeks  Discharge f/u Dr. Huel Cote as scheduled in 1 week  Discharge Medications Prenatal Vitamins

## 2013-07-23 NOTE — Progress Notes (Signed)
Patient ID: Kylie Lee, female   DOB: 13-Jan-1981, 32 y.o.   MRN: [id number] Pt received terbutaline x 1 FHR category 1 [place] confirms breech with head in the upper left quadrant  Version attempted x 3 with forward roll x 2 and backward roll x1 No significant movement with any attempt FHR stable throughout attempt.  Will monitor x 45 minutes and if HR stable d/c home Plan C/S at 39 weeks

## 2013-07-30 ENCOUNTER — Other Ambulatory Visit: Payer: Self-pay | Admitting: Obstetrics and Gynecology

## 2013-08-02 ENCOUNTER — Inpatient Hospital Stay (HOSPITAL_COMMUNITY)
Admission: AD | Admit: 2013-08-02 | Discharge: 2013-08-02 | Disposition: A | Discharge status: Home / Self Care | Payer: Commercial Managed Care - PPO | Source: Ambulatory Visit | Attending: Obstetrics and Gynecology | Admitting: Obstetrics and Gynecology

## 2013-08-02 DIAGNOSIS — O26839 Pregnancy related renal disease, unspecified trimester: Secondary | ICD-10-CM | POA: Insufficient documentation

## 2013-08-02 NOTE — MAU Note (Signed)
Out pt. NST in MAU due to office closed. Reason: proteinuria.

## 2013-08-04 ENCOUNTER — Encounter (HOSPITAL_COMMUNITY): Payer: Self-pay | Admitting: General Practice

## 2013-08-04 ENCOUNTER — Observation Stay (HOSPITAL_COMMUNITY)
Admission: AD | Admit: 2013-08-04 | Discharge: 2013-08-05 | Disposition: A | Discharge status: Home / Self Care | Payer: Commercial Managed Care - PPO | Source: Ambulatory Visit | Attending: Obstetrics and Gynecology | Admitting: Obstetrics and Gynecology

## 2013-08-04 DIAGNOSIS — O479 False labor, unspecified: Principal | ICD-10-CM | POA: Insufficient documentation

## 2013-08-04 DIAGNOSIS — O99891 Other specified diseases and conditions complicating pregnancy: Secondary | ICD-10-CM | POA: Insufficient documentation

## 2013-08-04 DIAGNOSIS — R1031 Right lower quadrant pain: Secondary | ICD-10-CM | POA: Insufficient documentation

## 2013-08-04 DIAGNOSIS — W010XXA Fall on same level from slipping, tripping and stumbling without subsequent striking against object, initial encounter: Secondary | ICD-10-CM | POA: Insufficient documentation

## 2013-08-04 DIAGNOSIS — S3991XA Unspecified injury of abdomen, initial encounter: Secondary | ICD-10-CM

## 2013-08-04 DIAGNOSIS — Y9229 Other specified public building as the place of occurrence of the external cause: Secondary | ICD-10-CM | POA: Insufficient documentation

## 2013-08-04 LAB — CBC
HCT: 36.4 % (ref 36.0–46.0)
MCH: 29.5 pg (ref 26.0–34.0)
MCHC: 33.8 g/dL (ref 30.0–36.0)
MCV: 87.3 fL (ref 78.0–100.0)
Platelets: 259 10*3/uL (ref 150–400)
RDW: 13.4 % (ref 11.5–15.5)
WBC: 11.3 10*3/uL — ABNORMAL HIGH (ref 4.0–10.5)

## 2013-08-04 MED ORDER — LACTATED RINGERS IV SOLN
INTRAVENOUS | Status: DC
  Administered 2013-08-04 – 2013-08-05 (×2): via INTRAVENOUS

## 2013-08-04 MED ORDER — ACETAMINOPHEN 325 MG PO TABS
650.0000 mg | ORAL_TABLET | ORAL | Status: DC | PRN
  Administered 2013-08-04 – 2013-08-05 (×3): 650 mg via ORAL
  Filled 2013-08-04 (×3): qty 2

## 2013-08-04 MED ORDER — ZOLPIDEM TARTRATE 5 MG PO TABS
5.0000 mg | ORAL_TABLET | Freq: Every evening | ORAL | Status: DC | PRN
  Administered 2013-08-04: 5 mg via ORAL
  Filled 2013-08-04: qty 1

## 2013-08-04 MED ORDER — DOCUSATE SODIUM 100 MG PO CAPS: 100.0000 mg | ORAL_CAPSULE | Freq: Every day | ORAL | Status: DC

## 2013-08-04 MED ORDER — PRENATAL MULTIVITAMIN CH
1.0000 | ORAL_TABLET | Freq: Every day | ORAL | Status: DC
  Filled 2013-08-04: qty 1

## 2013-08-04 MED ORDER — CALCIUM CARBONATE ANTACID 500 MG PO CHEW: 2.0000 | CHEWABLE_TABLET | ORAL | Status: DC | PRN

## 2013-08-04 NOTE — MAU Provider Note (Signed)
History     CSN: [id number]  Arrival date and time: 08/04/13 1650   First Provider Initiated Contact with Patient 08/04/13 1803      Chief Complaint  Patient presents with  . Fall   HPI  Ms. Kylie Lee is a 32 y.o. female who presents following a fall she had around 4:00 pm; she tripped over the gas pump cord and landed on her knees and then onto her abdomen. She denies bleeding or gush of fluid. She is scheduled for a c-section in 5 days for breech presentation. She reports good fetal movement, denies LOF, vaginal bleeding, vaginal itching/burning, urinary symptoms, h/a, dizziness, n/v, or fever/chills.  She is having some right lower quadrant discomfort that she rates 2/10.   OB History   Grav Para Term Preterm Abortions TAB SAB Ect Mult Living   3 1 1  1  1   1       Past Medical History  Diagnosis Date  . Noncompliant pregnant patient     35wk  . History of HPV infection   . Headache(784.0)     migraine  . Hx of varicella     Past Surgical History  Procedure Laterality Date  . Eye surgery      lasik  . Breast surgery      aug  . Colposcopy      Family History  Problem Relation Age of Onset  . Thyroid disease Father   . Hypothyroidism Father   . Thyroid disease Maternal Grandmother   . Arthritis Paternal Grandmother     rheumatoid  . Cancer Paternal Grandfather     colon  . Migraines Mother     History  Substance Use Topics  . Smoking status: Never Smoker   . Smokeless tobacco: Never Used  . Alcohol Use: No    Allergies: No Known Allergies  Prescriptions prior to admission  Medication Sig Dispense Refill  . Prenatal Vit-Fe Fumarate-FA (PRENATAL MULTIVITAMIN) TABS tablet Take 1 tablet by mouth daily at 12 noon.        Review of Systems  Gastrointestinal: Positive for abdominal pain.       Right lower quadrant discomfort.   Genitourinary: Negative for dysuria, urgency, frequency and hematuria.       No vaginal discharge. No vaginal  bleeding. No dysuria.    Physical Exam   Blood pressure 122/79, pulse 75, temperature 97.8 F (36.6 C), temperature source Oral, resp. rate 18, height 5\' 2"  (1.575 m), weight 71.305 kg (157 lb 3.2 oz), last menstrual period 11/06/2012.  Physical Exam  Constitutional: She is oriented to person, place, and time. She appears well-developed and well-nourished. No distress.  HENT:  Head: Normocephalic.  Eyes: Conjunctivae are normal.  Neck: Neck supple.  Respiratory: Effort normal.  GI: Soft. She exhibits no distension. There is no tenderness. There is no rebound and no guarding.  Neurological: She is alert and oriented to person, place, and time.  Skin: Skin is warm. She is not diaphoretic.  Psychiatric: Her behavior is normal.    Fetal Tracing: Baseline: 120 bpm  Variability: moderate  Accelerations: 15x15 Decelerations: None Toco: Irregular contraction pattern, 2-7 mins apart with UI    MAU Course  Procedures None  MDM NST  PO hydration  Consulted with Dr Jackelyn Knife; plan to keep patient for 4 hours of monitoring. Will notify him of changes in uterine pattern. Patient feeling braxton hicks contractions however states its the same pain she has felt.   Assessment and  Plan   Report given to [facility] who resumes care of the patient.   Iona Hansen Rasch, NP  08/04/2013, 6:03 PM   2000 - Care assumed from Venia Carbon, NP  Fetal Monitoring: Baseline: 120 bpm, moderate variability, + accelerations, no decelerations Contractions: irregular contractions, q 2-7 minutes  MDM Discussed change in patient's uterine activity with Dr. Jackelyn Knife.  Admit to antenatal for observation  A: Fall Uterine contractions  P: Admit to antenatal for observation  Freddi Starr, PA-C 08/04/2013 8:26 PM

## 2013-08-04 NOTE — MAU Note (Signed)
Pt presents to MAU with c/o falling at gas station. States she tripped and fell on her knees and abdomen.

## 2013-08-05 NOTE — Progress Notes (Signed)
Discharge instructions given, pt verbalizes and understands, pt dc'd home 

## 2013-08-05 NOTE — H&P (Signed)
History    CSN: [id number]  Arrival date and time: 08/04/13 1650  First Provider Initiated Contact with Patient 08/04/13 1803  Chief Complaint   Patient presents with   .  Fall    HPI  Ms. Kylie Lee is a 32 y.o. female who presents following a fall she had around 4:00 pm; she tripped over the gas pump cord and landed on her knees and then onto her abdomen. She denies bleeding or gush of fluid. She is scheduled for a c-section in 5 days for breech presentation. She reports good fetal movement, denies LOF, vaginal bleeding, vaginal itching/burning, urinary symptoms, h/a, dizziness, n/v, or fever/chills. She is having some right lower quadrant discomfort that she rates 2/10.  OB History    Grav  Para  Term  Preterm  Abortions  TAB  SAB  Ect  Mult  Living    3  1  1   1   1    1       Past Medical History   Diagnosis  Date   .  Noncompliant pregnant patient      35wk   .  History of HPV infection    .  Headache(784.0)      migraine   .  Hx of varicella     Past Surgical History   Procedure  Laterality  Date   .  Eye surgery       lasik   .  Breast surgery       aug   .  Colposcopy      Family History   Problem  Relation  Age of Onset   .  Thyroid disease  Father    .  Hypothyroidism  Father    .  Thyroid disease  Maternal Grandmother    .  Arthritis  Paternal Grandmother      rheumatoid   .  Cancer  Paternal Grandfather      colon   .  Migraines  Mother     History   Substance Use Topics   .  Smoking status:  Never Smoker   .  Smokeless tobacco:  Never Used   .  Alcohol Use:  No    Allergies: No Known Allergies  Prescriptions prior to admission   Medication  Sig  Dispense  Refill   .  Prenatal Vit-Fe Fumarate-FA (PRENATAL MULTIVITAMIN) TABS tablet  Take 1 tablet by mouth daily at 12 noon.      Review of Systems  Gastrointestinal: Positive for abdominal pain.  Right lower quadrant discomfort.  Genitourinary: Negative for dysuria, urgency, frequency and  hematuria.  No vaginal discharge.  No vaginal bleeding.  No dysuria.   Physical Exam   Blood pressure 122/79, pulse 75, temperature 97.8 F (36.6 C), temperature source Oral, resp. rate 18, height 5\' 2"  (1.575 m), weight 71.305 kg (157 lb 3.2 oz), last menstrual period 11/06/2012.  Physical Exam  Constitutional: She is oriented to person, place, and time. She appears well-developed and well-nourished. No distress.  HENT:  Head: Normocephalic.  Eyes: Conjunctivae are normal.  Neck: Neck supple.  Respiratory: Effort normal.  GI: Soft. She exhibits no distension. There is no tenderness. There is no rebound and no guarding.  Neurological: She is alert and oriented to person, place, and time.  Skin: Skin is warm. She is not diaphoretic.  Psychiatric: Her behavior is normal.   Fetal Tracing:  Baseline: 120 bpm  Variability: moderate  Accelerations: 15x15  Decelerations: None  Toco: Irregular contraction pattern, 2-7 mins apart with UI  MAU Course   Procedures  None  MDM  NST  PO hydration  Consulted with Dr Jackelyn Knife; plan to keep patient for 4 hours of monitoring. Will notify him of changes in uterine pattern.  Patient feeling braxton hicks contractions however states its the same pain she has felt.  Assessment and Plan   Report given to [facility] who resumes care of the patient.  Iona Hansen Rasch, NP  08/04/2013, 6:03 PM  2000 - Care assumed from Venia Carbon, NP  Fetal Monitoring:  Baseline: 120 bpm, moderate variability, + accelerations, no decelerations  Contractions: irregular contractions, q 2-7 minutes  MDM  Discussed change in patient's uterine activity with Dr. Jackelyn Knife.  Admit to antenatal for observation  A:  Fall  Uterine contractions  P:  Admit to antenatal for observation   Pt admitted for observation last pm after fall with abd trauma.  She had increasing ctx while in MAU.  Overnight, ctx have decreased, good FM, no VB or LOF.  Will monitor  until about 24 hrs from fall and d/c if remains stable.  FHT Cat I, has had 2 random decels but the tracing is reactive otherwise with irreg ctx that have spaced out.

## 2013-08-07 ENCOUNTER — Encounter (HOSPITAL_COMMUNITY): Payer: Self-pay

## 2013-08-07 ENCOUNTER — Encounter (HOSPITAL_COMMUNITY)
Admission: RE | Admit: 2013-08-07 | Discharge: 2013-08-07 | Disposition: A | Discharge status: Home / Self Care | Payer: Commercial Managed Care - PPO | Source: Ambulatory Visit | Attending: Obstetrics and Gynecology | Admitting: Obstetrics and Gynecology

## 2013-08-07 DIAGNOSIS — Z01812 Encounter for preprocedural laboratory examination: Secondary | ICD-10-CM | POA: Insufficient documentation

## 2013-08-07 DIAGNOSIS — Z01818 Encounter for other preprocedural examination: Secondary | ICD-10-CM | POA: Insufficient documentation

## 2013-08-07 LAB — RPR: RPR Ser Ql: NONREACTIVE

## 2013-08-07 LAB — CBC
MCHC: 33.4 g/dL (ref 30.0–36.0)
MCV: 87.4 fL (ref 78.0–100.0)
Platelets: 228 10*3/uL (ref 150–400)
RDW: 13.5 % (ref 11.5–15.5)

## 2013-08-07 LAB — COMPREHENSIVE METABOLIC PANEL
AST: 16 U/L (ref 0–37)
Alkaline Phosphatase: 85 U/L (ref 39–117)
GFR calc Af Amer: >90 mL/min (ref >90)
GFR calc non Af Amer: >90 mL/min (ref >90)
Potassium: 3.9 mEq/L (ref 3.7–5.3)
Sodium: 136 mEq/L — ABNORMAL LOW (ref 137–147)
Total Bilirubin: 0.2 mg/dL — ABNORMAL LOW (ref 0.3–1.2)

## 2013-08-07 NOTE — H&P (Signed)
Kylie Lee is a 32 y.o. female G3P1o11 at 39+ weeks (EDD 08/13/13 by LMP c/w 8 week [place]) presenting for scheduled c-section for persistent breech presentation that failed version attempt. Prenatal care has been significant for isolated proteinuria with normal labs and blood pressures.  Pt had a similar course with her last pregnancy and the proteinuria resolved between pregnancies, but a 24 hour urine this pregnancy demonstrated 1000+mg/24hour.  She has been seen in consultation in past and the proteinuria is not thought to be an issue since it resolves.  She did have a fall and struck her abdomen a few days prior to section and required extended monitoring for that.  Maternal Medical History:  Contractions: Frequency: irregular.   Perceived severity is mild.    Fetal activity: Perceived fetal activity is normal.    Prenatal Complications - Diabetes: none.    OB History   Grav Para Term Preterm Abortions TAB SAB Ect Mult Living   3 1 1  1  1   1     NSVD x 1 SAB x1  Past Medical History  Diagnosis Date  . Noncompliant pregnant patient     35wk  . History of HPV infection   . Headache(784.0)     migraine  . Hx of varicella    Past Surgical History  Procedure Laterality Date  . Eye surgery      lasik  . Breast surgery      aug  . Colposcopy     Family History: family history includes Arthritis in her paternal grandmother; Cancer in her paternal grandfather; Hypothyroidism in her father; Migraines in her mother; Thyroid disease in her father and maternal grandmother. Social History:  reports that she has never smoked. She has never used smokeless tobacco. She reports that she does not drink alcohol or use illicit drugs.   Prenatal Transfer Tool  Maternal Diabetes: No Genetic Screening: Declined Maternal Ultrasounds/Referrals: Normal Fetal Ultrasounds or other Referrals:  None Maternal Substance Abuse:  No Significant Maternal Medications:  None Significant Maternal Lab  Results:  None Other Comments:  None  Review of Systems  Neurological: Negative for headaches.      Last menstrual period 11/06/2012. Maternal Exam:  Uterine Assessment: Contraction strength is mild.  Contraction frequency is irregular.   Abdomen: Patient reports no abdominal tenderness. Fetal presentation: breech  Introitus: Normal vulva. Normal vagina.    Physical Exam  Constitutional: She is oriented to person, place, and time. She appears well-developed and well-nourished.  Cardiovascular: Normal rate and regular rhythm.   Respiratory: Effort normal.  GI: Soft.  Genitourinary: Vagina normal and uterus normal.  Neurological: She is alert and oriented to person, place, and time.  Psychiatric: She has a normal mood and affect. Her behavior is normal.    Prenatal labs: ABO, Rh: --/--/O POS (12/31 0855) Antibody: NEG (12/31 0855) Rubella: Immune (06/11 0000) RPR: NON REACTIVE (12/31 0855)  HBsAg: Negative (06/11 0000)  HIV: Non-reactive (06/11 0000)  GBS: Negative (12/04 0000)  One hour GTT 110  Assessment/Plan: Pt was counseled on the risks and benefits of c-section including bleeding, infection, and possible damage to bowel and bladder.  The procedure was reviewed in detail.  She is ready to proceed.   Oliver Pila 08/07/2013, 7:30 PM

## 2013-08-07 NOTE — Discharge Summary (Signed)
Physician Discharge Summary  Patient ID: Kylie Lee MRN: [id number] DOB/AGE: 1981/03/03 32 y.o.  Admit date: 08/04/2013 Discharge date: 08/07/2013  Admission Diagnoses:  IUP at 38+ weeks, abdominal trauma from fall  Discharge Diagnoses:  Same Active Problems:   Blunt abdominal trauma   Discharged Condition: good  Hospital Course: Pt admitted for observation due to persistent ctx after fall.  Baby remained reactive, ctx spaced out, stable for discharge after 24 hours of observation from time of fall.  Discharge Exam: Blood pressure 134/73, pulse 73, temperature 97.7 F (36.5 C), temperature source Oral, resp. rate 20, height 5\' 2"  (1.575 m), weight 71.215 kg (157 lb), last menstrual period 11/06/2012. General appearance: alert  Disposition: 01-Home or Self Care  Discharge Orders   Future Orders Complete By Expires   Discharge activity:  No Restrictions  As directed    Discharge diet:  No restrictions  As directed    LABOR:  When conractions begin, you should start to time them from the beginning of one contraction to the beginning  of the next.  When contractions are 5 - 10 minutes apart or less and have been regular for at least an hour, you should call your health care provider.  As directed    No sexual activity restrictions  As directed    Notify physician for bleeding from the vagina  As directed    Notify physician for blurring of vision or spots before the eyes  As directed    Notify physician for chills or fever  As directed    Notify physician for fainting spells, "black outs" or loss of consciousness  As directed    Notify physician for increase in vaginal discharge  As directed    Notify physician for leaking of fluid  As directed    Notify physician for pain or burning when urinating  As directed    Notify physician for pelvic pressure (sudden increase)  As directed    Notify physician for severe or continued nausea or vomiting  As directed    Notify physician  for sudden gushing of fluid from the vagina (with or without continued leaking)  As directed    Notify physician for sudden, constant, or occasional abdominal pain  As directed    Notify physician if baby moving less than usual  As directed        Medication List         prenatal multivitamin Tabs tablet  Take 1 tablet by mouth daily at 12 noon.           Follow-up Information   Please follow up. (As scheduled)       Signed: Lanyah Spengler D 08/07/2013, 8:39 AM

## 2013-08-07 NOTE — Patient Instructions (Signed)
20 Kylie Lee  08/07/2013   Your procedure is scheduled on:  1//2/15  Enter through the Main Entrance of [facility] at 11 AM.  Pick up the phone at the desk and dial [phone].   Call this number if you have problems the morning of surgery: [phone]   Remember:   Do not eat food:After Midnight.  Do not drink clear liquids: 4 Hours before arrival.  Take these medicines the morning of surgery with A SIP OF WATER: NA   Do not wear jewelry, make-up or nail polish.  Do not wear lotions, powders, or perfumes. You may wear deodorant.  Do not shave 48 hours prior to surgery.  Do not bring valuables to the hospital.  [facility] is not   responsible for any belongings or valuables brought to the hospital.  Contacts, dentures or bridgework may not be worn into surgery.  Leave suitcase in the car. After surgery it may be brought to your room.  For patients admitted to the hospital, checkout time is 11:00 AM the day of              discharge.   Patients discharged the day of surgery will not be allowed to drive             home.  Name and phone number of your driver: NA  Special Instructions:   Shower using CHG 2 nights before surgery and the night before surgery.  If you shower the day of surgery use CHG.  Use special wash - you have one bottle of CHG for all showers.  You should use approximately 1/3 of the bottle for each shower.   Please read over the following fact sheets that you were given:   Surgical Site Infection Prevention

## 2013-08-09 ENCOUNTER — Encounter (HOSPITAL_COMMUNITY): Payer: Self-pay | Admitting: Certified Registered"

## 2013-08-09 ENCOUNTER — Encounter (HOSPITAL_COMMUNITY): Payer: PRIVATE HEALTH INSURANCE | Admitting: Anesthesiology

## 2013-08-09 ENCOUNTER — Inpatient Hospital Stay (HOSPITAL_COMMUNITY): Payer: PRIVATE HEALTH INSURANCE | Admitting: Anesthesiology

## 2013-08-09 ENCOUNTER — Inpatient Hospital Stay (HOSPITAL_COMMUNITY)
Admission: RE | Admit: 2013-08-09 | Discharge: 2013-08-11 | DRG: 766 | Disposition: A | Discharge status: Home / Self Care | Payer: PRIVATE HEALTH INSURANCE | Source: Ambulatory Visit | Attending: Obstetrics and Gynecology | Admitting: Obstetrics and Gynecology

## 2013-08-09 ENCOUNTER — Encounter (HOSPITAL_COMMUNITY)
Admission: RE | Disposition: A | Discharge status: Home / Self Care | Payer: Self-pay | Source: Ambulatory Visit | Attending: Obstetrics and Gynecology

## 2013-08-09 DIAGNOSIS — O321XX0 Maternal care for breech presentation, not applicable or unspecified: Principal | ICD-10-CM | POA: Diagnosis present

## 2013-08-09 DIAGNOSIS — Z98891 History of uterine scar from previous surgery: Secondary | ICD-10-CM

## 2013-08-09 DIAGNOSIS — O321XX1 Maternal care for breech presentation, fetus 1: Secondary | ICD-10-CM

## 2013-08-09 SURGERY — Surgical Case
Anesthesia: Spinal | Site: Abdomen

## 2013-08-09 MED ORDER — OXYTOCIN 10 UNIT/ML IJ SOLN
40.0000 [IU] | INTRAVENOUS | Status: DC | PRN
  Administered 2013-08-09: 40 [IU] via INTRAVENOUS

## 2013-08-09 MED ORDER — MEPERIDINE HCL 25 MG/ML IJ SOLN: 6.2500 mg | INTRAMUSCULAR | Status: DC | PRN

## 2013-08-09 MED ORDER — MENTHOL 3 MG MT LOZG: 1.0000 | LOZENGE | OROMUCOSAL | Status: DC | PRN

## 2013-08-09 MED ORDER — FENTANYL CITRATE 0.05 MG/ML IJ SOLN
INTRAMUSCULAR | Status: AC
  Filled 2013-08-09: qty 2

## 2013-08-09 MED ORDER — WITCH HAZEL-GLYCERIN EX PADS: 1.0000 "application " | MEDICATED_PAD | CUTANEOUS | Status: DC | PRN

## 2013-08-09 MED ORDER — METOCLOPRAMIDE HCL 5 MG/ML IJ SOLN: 10.0000 mg | Freq: Three times a day (TID) | INTRAMUSCULAR | Status: DC | PRN

## 2013-08-09 MED ORDER — OXYTOCIN 40 UNITS IN LACTATED RINGERS INFUSION - SIMPLE MED: 62.5000 mL/h | INTRAVENOUS | Status: AC

## 2013-08-09 MED ORDER — PHENYLEPHRINE 8 MG IN D5W 100 ML (0.08MG/ML) PREMIX OPTIME
INJECTION | INTRAVENOUS | Status: DC | PRN
  Administered 2013-08-09: 60 ug/min via INTRAVENOUS

## 2013-08-09 MED ORDER — KETOROLAC TROMETHAMINE 30 MG/ML IJ SOLN
INTRAMUSCULAR | Status: AC
  Filled 2013-08-09: qty 1

## 2013-08-09 MED ORDER — DIPHENHYDRAMINE HCL 25 MG PO CAPS
25.0000 mg | ORAL_CAPSULE | Freq: Four times a day (QID) | ORAL | Status: DC | PRN
  Filled 2013-08-09: qty 1

## 2013-08-09 MED ORDER — MIDAZOLAM HCL 2 MG/2ML IJ SOLN: 0.5000 mg | Freq: Once | INTRAMUSCULAR | Status: DC | PRN

## 2013-08-09 MED ORDER — FENTANYL CITRATE 0.05 MG/ML IJ SOLN
INTRAMUSCULAR | Status: DC | PRN
  Administered 2013-08-09: 15 ug via INTRATHECAL
  Administered 2013-08-09: 85 ug via INTRAVENOUS

## 2013-08-09 MED ORDER — ONDANSETRON HCL 4 MG/2ML IJ SOLN
4.0000 mg | Freq: Three times a day (TID) | INTRAMUSCULAR | Status: DC | PRN
Start: 2013-08-09 — End: 2013-08-11

## 2013-08-09 MED ORDER — DIPHENHYDRAMINE HCL 50 MG/ML IJ SOLN: 25.0000 mg | INTRAMUSCULAR | Status: DC | PRN

## 2013-08-09 MED ORDER — SIMETHICONE 80 MG PO CHEW: 80.0000 mg | CHEWABLE_TABLET | ORAL | Status: DC | PRN

## 2013-08-09 MED ORDER — OXYTOCIN 10 UNIT/ML IJ SOLN
INTRAMUSCULAR | Status: AC
  Filled 2013-08-09: qty 4

## 2013-08-09 MED ORDER — DIPHENHYDRAMINE HCL 25 MG PO CAPS
25.0000 mg | ORAL_CAPSULE | ORAL | Status: DC | PRN
  Filled 2013-08-09: qty 1

## 2013-08-09 MED ORDER — SIMETHICONE 80 MG PO CHEW
80.0000 mg | CHEWABLE_TABLET | ORAL | Status: DC
  Administered 2013-08-09: 80 mg via ORAL
  Filled 2013-08-09 (×2): qty 1

## 2013-08-09 MED ORDER — KETOROLAC TROMETHAMINE 30 MG/ML IJ SOLN: 30.0000 mg | Freq: Four times a day (QID) | INTRAMUSCULAR | Status: AC | PRN

## 2013-08-09 MED ORDER — ZOLPIDEM TARTRATE 5 MG PO TABS: 5.0000 mg | ORAL_TABLET | Freq: Every evening | ORAL | Status: DC | PRN

## 2013-08-09 MED ORDER — ACETAMINOPHEN 500 MG PO TABS
1000.0000 mg | ORAL_TABLET | Freq: Four times a day (QID) | ORAL | Status: AC
  Administered 2013-08-09 (×2): 1000 mg via ORAL
  Filled 2013-08-09 (×2): qty 2

## 2013-08-09 MED ORDER — GLYCOPYRROLATE 0.2 MG/ML IJ SOLN
INTRAMUSCULAR | Status: DC | PRN
  Administered 2013-08-09: 0.2 mg via INTRAVENOUS

## 2013-08-09 MED ORDER — ONDANSETRON HCL 4 MG/2ML IJ SOLN: 4.0000 mg | INTRAMUSCULAR | Status: DC | PRN

## 2013-08-09 MED ORDER — BUPIVACAINE IN DEXTROSE 0.75-8.25 % IT SOLN
INTRATHECAL | Status: DC | PRN
  Administered 2013-08-09: 1.2 mL via INTRATHECAL

## 2013-08-09 MED ORDER — ONDANSETRON HCL 4 MG/2ML IJ SOLN
INTRAMUSCULAR | Status: DC | PRN
  Administered 2013-08-09: 4 mg via INTRAVENOUS

## 2013-08-09 MED ORDER — PHENYLEPHRINE 8 MG IN D5W 100 ML (0.08MG/ML) PREMIX OPTIME
INJECTION | INTRAVENOUS | Status: AC
  Filled 2013-08-09: qty 100

## 2013-08-09 MED ORDER — SENNOSIDES-DOCUSATE SODIUM 8.6-50 MG PO TABS
2.0000 | ORAL_TABLET | ORAL | Status: DC
  Administered 2013-08-09 – 2013-08-11 (×2): 2 via ORAL
  Filled 2013-08-09 (×4): qty 2

## 2013-08-09 MED ORDER — TETANUS-DIPHTH-ACELL PERTUSSIS 5-2.5-18.5 LF-MCG/0.5 IM SUSP: 0.5000 mL | Freq: Once | INTRAMUSCULAR | Status: DC

## 2013-08-09 MED ORDER — DIPHENHYDRAMINE HCL 50 MG/ML IJ SOLN: 12.5000 mg | INTRAMUSCULAR | Status: DC | PRN

## 2013-08-09 MED ORDER — NALOXONE HCL 1 MG/ML IJ SOLN: 1.0000 ug/kg/h | INTRAVENOUS | Status: DC | PRN

## 2013-08-09 MED ORDER — NALOXONE HCL 0.4 MG/ML IJ SOLN
0.4000 mg | INTRAMUSCULAR | Status: DC | PRN
Start: 2013-08-09 — End: 2013-08-11

## 2013-08-09 MED ORDER — SODIUM CHLORIDE 0.9 % IJ SOLN: 3.0000 mL | INTRAMUSCULAR | Status: DC | PRN

## 2013-08-09 MED ORDER — OXYCODONE-ACETAMINOPHEN 5-325 MG PO TABS
1.0000 | ORAL_TABLET | ORAL | Status: DC | PRN
  Administered 2013-08-10 (×3): 1 via ORAL
  Administered 2013-08-10: 2 via ORAL
  Administered 2013-08-10 – 2013-08-11 (×4): 1 via ORAL
  Filled 2013-08-09 (×2): qty 1
  Filled 2013-08-09: qty 2
  Filled 2013-08-09 (×5): qty 1

## 2013-08-09 MED ORDER — ONDANSETRON HCL 4 MG PO TABS: 4.0000 mg | ORAL_TABLET | ORAL | Status: DC | PRN

## 2013-08-09 MED ORDER — DIBUCAINE 1 % RE OINT: 1.0000 "application " | TOPICAL_OINTMENT | RECTAL | Status: DC | PRN

## 2013-08-09 MED ORDER — PRENATAL MULTIVITAMIN CH
1.0000 | ORAL_TABLET | Freq: Every day | ORAL | Status: DC
  Filled 2013-08-09 (×2): qty 1

## 2013-08-09 MED ORDER — PROMETHAZINE HCL 25 MG/ML IJ SOLN: 6.2500 mg | INTRAMUSCULAR | Status: DC | PRN

## 2013-08-09 MED ORDER — CEFAZOLIN SODIUM-DEXTROSE 2-3 GM-% IV SOLR
2.0000 g | INTRAVENOUS | Status: AC
  Administered 2013-08-09: 2 g via INTRAVENOUS

## 2013-08-09 MED ORDER — IBUPROFEN 600 MG PO TABS: 600.0000 mg | ORAL_TABLET | Freq: Four times a day (QID) | ORAL | Status: DC | PRN

## 2013-08-09 MED ORDER — SCOPOLAMINE 1 MG/3DAYS TD PT72
1.0000 | MEDICATED_PATCH | Freq: Once | TRANSDERMAL | Status: DC
  Administered 2013-08-09: 1.5 mg via TRANSDERMAL

## 2013-08-09 MED ORDER — LANOLIN HYDROUS EX OINT: 1.0000 "application " | TOPICAL_OINTMENT | CUTANEOUS | Status: DC | PRN

## 2013-08-09 MED ORDER — FENTANYL CITRATE 0.05 MG/ML IJ SOLN
25.0000 ug | INTRAMUSCULAR | Status: DC | PRN
  Administered 2013-08-09: 50 ug via INTRAVENOUS

## 2013-08-09 MED ORDER — MEPERIDINE HCL 25 MG/ML IJ SOLN
6.2500 mg | INTRAMUSCULAR | Status: DC | PRN
Start: 2013-08-09 — End: 2013-08-09

## 2013-08-09 MED ORDER — SCOPOLAMINE 1 MG/3DAYS TD PT72: 1.0000 | MEDICATED_PATCH | Freq: Once | TRANSDERMAL | Status: DC

## 2013-08-09 MED ORDER — LACTATED RINGERS IV SOLN
INTRAVENOUS | Status: DC
  Administered 2013-08-09 (×2): via INTRAVENOUS

## 2013-08-09 MED ORDER — ONDANSETRON HCL 4 MG/2ML IJ SOLN
INTRAMUSCULAR | Status: AC
  Filled 2013-08-09: qty 2

## 2013-08-09 MED ORDER — SCOPOLAMINE 1 MG/3DAYS TD PT72
MEDICATED_PATCH | TRANSDERMAL | Status: AC
Start: 2013-08-09 — End: 2013-08-09
  Filled 2013-08-09: qty 1

## 2013-08-09 MED ORDER — CEFAZOLIN SODIUM-DEXTROSE 2-3 GM-% IV SOLR
INTRAVENOUS | Status: AC
Start: 2013-08-09 — End: 2013-08-09
  Filled 2013-08-09: qty 50

## 2013-08-09 MED ORDER — NALBUPHINE HCL 10 MG/ML IJ SOLN
5.0000 mg | INTRAMUSCULAR | Status: DC | PRN
  Administered 2013-08-09: 10 mg via SUBCUTANEOUS
  Filled 2013-08-09: qty 1

## 2013-08-09 MED ORDER — NALBUPHINE HCL 10 MG/ML IJ SOLN: 5.0000 mg | INTRAMUSCULAR | Status: DC | PRN

## 2013-08-09 MED ORDER — LACTATED RINGERS IV SOLN
Freq: Once | INTRAVENOUS | Status: AC
  Administered 2013-08-09: 13:00:00 via INTRAVENOUS

## 2013-08-09 MED ORDER — SIMETHICONE 80 MG PO CHEW
80.0000 mg | CHEWABLE_TABLET | Freq: Three times a day (TID) | ORAL | Status: DC
  Administered 2013-08-10 – 2013-08-11 (×3): 80 mg via ORAL
  Filled 2013-08-09 (×8): qty 1

## 2013-08-09 MED ORDER — MORPHINE SULFATE 0.5 MG/ML IJ SOLN
INTRAMUSCULAR | Status: AC
  Filled 2013-08-09: qty 10

## 2013-08-09 MED ORDER — MORPHINE SULFATE (PF) 0.5 MG/ML IJ SOLN
INTRAMUSCULAR | Status: DC | PRN
  Administered 2013-08-09: .1 mg via INTRATHECAL

## 2013-08-09 MED ORDER — IBUPROFEN 600 MG PO TABS
600.0000 mg | ORAL_TABLET | Freq: Four times a day (QID) | ORAL | Status: DC
  Administered 2013-08-09 – 2013-08-11 (×6): 600 mg via ORAL
  Filled 2013-08-09 (×7): qty 1

## 2013-08-09 MED ORDER — LACTATED RINGERS IV SOLN
INTRAVENOUS | Status: DC
  Administered 2013-08-10: 04:00:00 via INTRAVENOUS

## 2013-08-09 MED ORDER — KETOROLAC TROMETHAMINE 30 MG/ML IJ SOLN
30.0000 mg | Freq: Four times a day (QID) | INTRAMUSCULAR | Status: AC | PRN
  Administered 2013-08-09 (×2): 30 mg via INTRAVENOUS
  Filled 2013-08-09: qty 1

## 2013-08-09 SURGICAL SUPPLY — 33 items
BENZOIN TINCTURE PRP APPL 2/3 (GAUZE/BANDAGES/DRESSINGS) IMPLANT
CLAMP CORD UMBIL (MISCELLANEOUS) IMPLANT
CLOTH BEACON ORANGE TIMEOUT ST (SAFETY) ×2 IMPLANT
DRAPE LG THREE QUARTER DISP (DRAPES) IMPLANT
DRSG OPSITE POSTOP 4X10 (GAUZE/BANDAGES/DRESSINGS) ×2 IMPLANT
DURAPREP 26ML APPLICATOR (WOUND CARE) ×2 IMPLANT
ELECT REM PT RETURN 9FT ADLT (ELECTROSURGICAL) ×2
ELECTRODE REM PT RTRN 9FT ADLT (ELECTROSURGICAL) ×1 IMPLANT
EXTRACTOR VACUUM KIWI (MISCELLANEOUS) IMPLANT
GLOVE BIO SURGEON STRL SZ 6.5 (GLOVE) ×2 IMPLANT
GOWN PREVENTION PLUS XLARGE (GOWN DISPOSABLE) ×4 IMPLANT
KIT ABG SYR 3ML LUER SLIP (SYRINGE) IMPLANT
NEEDLE HYPO 25X5/8 SAFETYGLIDE (NEEDLE) IMPLANT
NS IRRIG 1000ML POUR BTL (IV SOLUTION) ×2 IMPLANT
PACK C SECTION WH (CUSTOM PROCEDURE TRAY) ×2 IMPLANT
PAD OB MATERNITY 4.3X12.25 (PERSONAL CARE ITEMS) ×2 IMPLANT
RTRCTR C-SECT PINK 25CM LRG (MISCELLANEOUS) ×2 IMPLANT
STRIP CLOSURE SKIN 1/2X4 (GAUZE/BANDAGES/DRESSINGS) IMPLANT
SUT CHROMIC 1 CTX 36 (SUTURE) ×4 IMPLANT
SUT PLAIN 0 NONE (SUTURE) IMPLANT
SUT PLAIN 2 0 XLH (SUTURE) ×2 IMPLANT
SUT VIC AB 0 CT1 27 (SUTURE) ×2
SUT VIC AB 0 CT1 27XBRD ANBCTR (SUTURE) ×2 IMPLANT
SUT VIC AB 2-0 CT1 27 (SUTURE) ×1
SUT VIC AB 2-0 CT1 TAPERPNT 27 (SUTURE) ×1 IMPLANT
SUT VIC AB 3-0 CT1 27 (SUTURE)
SUT VIC AB 3-0 CT1 TAPERPNT 27 (SUTURE) IMPLANT
SUT VIC AB 3-0 SH 27 (SUTURE) ×1
SUT VIC AB 3-0 SH 27X BRD (SUTURE) ×1 IMPLANT
SUT VIC AB 4-0 KS 27 (SUTURE) ×2 IMPLANT
TOWEL OR 17X24 6PK STRL BLUE (TOWEL DISPOSABLE) ×2 IMPLANT
TRAY FOLEY CATH 14FR (SET/KITS/TRAYS/PACK) ×2 IMPLANT
WATER STERILE IRR 1000ML POUR (IV SOLUTION) IMPLANT

## 2013-08-09 NOTE — Anesthesia Procedure Notes (Signed)
Spinal  Patient location during procedure: OR Start time: 08/09/2013 12:58 PM Staffing Performed by: anesthesiologist  Preanesthetic Checklist Completed: patient identified, site marked, surgical consent, pre-op evaluation, timeout performed, IV checked, risks and benefits discussed and monitors and equipment checked Spinal Block Patient position: sitting Prep: DuraPrep Patient monitoring: heart rate, cardiac monitor, continuous pulse ox and blood pressure Approach: midline Location: L3-4 Injection technique: single-shot Needle Needle type: Sprotte  Needle gauge: 24 G Needle length: 9 cm Assessment Sensory level: T4 Additional Notes Clear free flow CSF on first attempt.  No paresthesia.  Patient tolerated procedure well with no apparent complications.  Charlton Haws, MD

## 2013-08-09 NOTE — Lactation Note (Signed)
This note was copied from the chart of Kylie Lee. Lactation Consultation Note  Patient Name: Kylie Lee Today's Date: 08/09/2013 Reason for consult: Initial assessment of this second-time mother and her newborn at 34 hours of age.  She breastfed first baby for 75 months (now 33 yo).  First baby had tight frenulum and it was clipped which resolved nipple soreness for mom but she has not noticed any latching problems or soreness with new baby.  Initial LATCh score =8 and baby has fed several times (20-30 minutes) and passed first void.  LC encouraged STS and cue feedings.  LC encouraged review of Baby and Me pp 9, 14 and 20-25 for STS and BF information. LC provided [employer] brochure and reviewed [facility] services and list of community and web site resources.    Maternal Data Formula Feeding for Exclusion: No Infant to breast within first hour of birth: Yes Has patient been taught Hand Expression?: Yes (experienced mom) Does the patient have breastfeeding experience prior to this delivery?: Yes  Feeding Feeding Type: Breast Fed  LATCH Score/Interventions            LATCH score=8 per RN assessment (experienced mom)          Lactation Tools Discussed/Used   STS, cue feedings  Consult Status Consult Status: Follow-up Date: 08/10/13 Follow-up type: In-patient    Junious Dresser [facility] 08/09/2013, 11:13 PM

## 2013-08-09 NOTE — Anesthesia Postprocedure Evaluation (Signed)
Anesthesia Post Note  Patient: Kylie Lee  Procedure(s) Performed: Procedure(s) (LRB): CESAREAN SECTION (N/A)  Anesthesia type: Spinal  Patient location: Mother/Baby  Post pain: Pain level controlled  Post assessment: Post-op Vital signs reviewed  Last Vitals:  Filed Vitals:   08/09/13 1725  BP: 125/89  Pulse: 72  Temp: 36.4 C  Resp: 20    Post vital signs: Reviewed  Level of consciousness: awake  Complications: No apparent anesthesia complications

## 2013-08-09 NOTE — Op Note (Signed)
Operative note  Preop diagnosis Term pregnancy at 39+ weeks Persistent breech lie failing version  Postop diagnosis Same  Procedure Primary low transverse C-section with 2 layer closure of uterus  Surgeon Dr. Paula Compton Dr. Thornell Sartorius  Anesthesia Spinal  Fluids Estimated blood loss 800 cc Urine output 150 cc clear  IV fluids 2200 cc LR  Findings There is a viable female infant in the frank breech presentation. Apgars were 8 and 9 weight pending at time of dictation. Uterus tubes and ovaries were normal with the left ovary being slightly adherent to the uterine fundus.  Specimen Placenta sent to L&D   Procedure Patient was taken to the operating room where spinal anesthesia was obtained without difficulty. She was prepped and draped in the normal sterile fashion in the dorsal supine position with a leftward tilt. An appropriate time out was performed. A Pfannenstiel skin incision was then made with the scalpel and carried through to underlying layer of fascia by sharp dissection and Bovie cautery. The fascia was then nicked in the midline and the incision carried laterally with Mayo scissors. The inferior aspect of the incision was grasped and elev elevated off the rectus muscles and dissected. As well the superior aspect was dissected the same fashion. The rectus muscles were then separated in the midline and the peritoneal cavity entered bluntly. Peritoneal incision extended both superiorly and inferiorly with careful attention to avoid both bowel bladder. The Alexis self-retaining wound retractor was then placed within the incision and the lower uterine segment exposed. The uterus was then incised in a transverse fashion and the cavity itself entered bluntly. The infant's feet were presenting with the bottom and these were grasped and easily delivered from the uterus with gentle traction gentle traction was continued up to the level of the scapula the arms were then reduced over  the chest and head delivered in a flexed fashion. Nose and mouth were bulb suctioned and the cord was clamped and cut with the infant handed to the waiting pediatricians. The placenta was then spontaneously expressed and the cavity cleared of all clots and debris with moist lap sponge. The uterine incision was then closed in 2 layers the first a running locked layer 1-0 chromic and the second an imbricating layer of the same suture. A figure-of-eight suture of 3-0 Vicryl was utilized to the midline of the uterus for a small area of bleeding and hemostatic. The tubes and ovaries were inspected and the gutters cleared of all clots and debris. All appeared hemostatic with the incision therefore all instruments and sponges were removed from the abdomen as well as the Alexis retractor. The rectus and peritoneum were reapproximated with several abrupt mattress sutures of 2-0 Vicryl. The fascia was closed with 0 Vicryl in a running fashion. Subcutaneous tissue was reapproximated with 3-0 plain in a running fashion. The skin was closed with 3-0 Vicryl in a subcuticular stitch. The incision was then reinforced with benzoin and Steri-Strips and final instrument and sponge counts correct. Patient was taken to the recovery room in good condition with her baby accompanying her.

## 2013-08-09 NOTE — Preoperative (Signed)
Beta Blockers   Reason not to administer Beta Blockers:Not Applicable 

## 2013-08-09 NOTE — Transfer of Care (Signed)
Immediate Anesthesia Transfer of Care Note  Patient: Kylie Lee  Procedure(s) Performed: Procedure(s) with comments: CESAREAN SECTION (N/A) - 1 1/2hrs OR time  Patient Location: PACU  Anesthesia Type:Spinal  Level of Consciousness: awake, alert  and oriented  Airway & Oxygen Therapy: Patient Spontanous Breathing  Post-op Assessment: Report given to PACU RN and Post -op Vital signs reviewed and stable  Post vital signs: Reviewed and stable  Complications: No apparent anesthesia complications

## 2013-08-09 NOTE — Brief Op Note (Signed)
08/09/2013  1:56 PM  PATIENT:  Kylie Lee  33 y.o. female  PRE-OPERATIVE DIAGNOSIS:  Breech presentation, [phone]  POST-OPERATIVE DIAGNOSIS:  Breech presentation, [phone]  PROCEDURE:  Procedure(s) with comments: CESAREAN SECTION (N/A) - 1 1/2hrs OR time LTCS with 2-layer closure of uterus  SURGEON:  Surgeon(s) and Role:    * Logan Bores, MD - Primary    * Thornell Sartorius, MD - Assisting   ANESTHESIA:   spinal  EBL:  Total I/O In: 1000 [I.V.:1000] Out: 1050 [Urine:250; Blood:800]  BLOOD ADMINISTERED:none  DRAINS: Urinary Catheter (Foley)   LOCAL MEDICATIONS USED:  NONE  SPECIMEN:  Placenta  DISPOSITION OF SPECIMEN:  L&D  COUNTS:  YES  TOURNIQUET:  * No tourniquets in log *  DICTATION: .Dragon Dictation  PLAN OF CARE: Admit to inpatient   PATIENT DISPOSITION:  PACU - hemodynamically stable.

## 2013-08-09 NOTE — Anesthesia Postprocedure Evaluation (Signed)
  Anesthesia Post-op Note  Anesthesia Post Note  Patient: Kylie Lee  Procedure(s) Performed: Procedure(s) (LRB): CESAREAN SECTION (N/A)  Anesthesia type: Spinal  Patient location: PACU  Post pain: Pain level controlled  Post assessment: Post-op Vital signs reviewed  Last Vitals:  Filed Vitals:   08/09/13 1445  BP: 127/97  Pulse: 57  Temp:   Resp: 15    Post vital signs: Reviewed  Level of consciousness: awake  Complications: No apparent anesthesia complications

## 2013-08-09 NOTE — Progress Notes (Signed)
Patient ID: Kylie Lee, female   DOB: 07/01/1981, 33 y.o.   MRN: [id number] Per pt no changes in dictated H&P.  Brief exam WNL.

## 2013-08-09 NOTE — Anesthesia Preprocedure Evaluation (Addendum)
Anesthesia Evaluation  Patient identified by MRN, date of birth, ID band Patient awake    Reviewed: Allergy & Precautions, H&P , NPO status , Patient's Chart, lab work & pertinent test results, reviewed documented beta blocker date and time   History of Anesthesia Complications Negative for: history of anesthetic complications  Airway Mallampati: I TM Distance: >3 FB Neck ROM: full    Dental no notable dental hx. (+) Teeth Intact   Pulmonary neg pulmonary ROS,  breath sounds clear to auscultation  Pulmonary exam normal       Cardiovascular Exercise Tolerance: Good negative cardio ROS  Rhythm:regular Rate:Normal     Neuro/Psych  Headaches (migraines - once a week (less in pregnancy)), negative psych ROS   GI/Hepatic negative GI ROS, Neg liver ROS,   Endo/Other  negative endocrine ROS  Renal/GU Proteinuria in pregnancy  negative genitourinary   Musculoskeletal   Abdominal Normal abdominal exam  (+)   Peds  Hematology negative hematology ROS (+)   Anesthesia Other Findings   Reproductive/Obstetrics (+) Pregnancy (breech for primary c/s)                          Anesthesia Physical Anesthesia Plan  ASA: II  Anesthesia Plan: Spinal   Post-op Pain Management:    Induction:   Airway Management Planned:   Additional Equipment:   Intra-op Plan:   Post-operative Plan:   Informed Consent: I have reviewed the patients History and Physical, chart, labs and discussed the procedure including the risks, benefits and alternatives for the proposed anesthesia with the patient or authorized representative who has indicated his/her understanding and acceptance.     Plan Discussed with: Anesthesiologist, CRNA and Surgeon  Anesthesia Plan Comments:         Anesthesia Quick Evaluation

## 2013-08-10 LAB — CBC
HEMATOCRIT: 29 % — AB (ref 36.0–46.0)
HEMOGLOBIN: 9.7 g/dL — AB (ref 12.0–15.0)
MCH: 29 pg (ref 26.0–34.0)
MCHC: 33.4 g/dL (ref 30.0–36.0)
MCV: 86.6 fL (ref 78.0–100.0)
Platelets: 183 10*3/uL (ref 150–400)
RBC: 3.35 MIL/uL — ABNORMAL LOW (ref 3.87–5.11)
RDW: 13.5 % (ref 11.5–15.5)
WBC: 13 10*3/uL — AB (ref 4.0–10.5)

## 2013-08-10 LAB — BIRTH TISSUE RECOVERY COLLECTION (PLACENTA DONATION)

## 2013-08-10 NOTE — Lactation Note (Signed)
This note was copied from the chart of Kylie Starlyn Gravett. Lactation Consultation Note Follow up:  Baby 20 hours old.  Mother of 2 with breastfeeding experience.  First child had tight frenulum clipped.  This baby latch score 9.  Mother needed some assistance with turning baby tummy to tummy.  Reviewed feeding cues, feeding him 8-12 times a day, deep wide latch, voids and stools.  Encouraged mother to call for further assistance or questions.   Patient Name: Kylie Lee Today's Date: 08/10/2013 Reason for consult: Follow-up assessment   Maternal Data    Feeding Feeding Type: Breast Fed Length of feed: 30 min (mother had baby latched prior to visit)  LATCH Score/Interventions Latch: Grasps breast easily, tongue down, lips flanged, rhythmical sucking.  Audible Swallowing: Spontaneous and intermittent  Type of Nipple: Everted at rest and after stimulation  Comfort (Breast/Nipple): Soft / non-tender     Hold (Positioning): Assistance needed to correctly position infant at breast and maintain latch.  LATCH Score: 9  Lactation Tools Discussed/Used     Consult Status Consult Status: PRN Date: 08/11/13    Kylie Lee [facility] 08/10/2013, 3:47 PM

## 2013-08-10 NOTE — Discharge Summary (Signed)
Obstetric Discharge Summary Reason for Admission: cesarean section Prenatal Procedures: ultrasound Intrapartum Procedures: cesarean: low cervical, transverse Postpartum Procedures: none Complications-Operative and Postpartum: none Hemoglobin  Date Value Range Status  08/10/2013 9.7* 12.0 - 15.0 g/dL Final     HCT  Date Value Range Status  08/10/2013 29.0* 36.0 - 46.0 % Final    Physical Exam:  General: alert and cooperative Lochia: appropriate Uterine Fundus: firm Incision: C/D/I    Discharge Diagnoses: Term Pregnancy-delivered  Discharge Information: Date: 08/11/2013 Activity: pelvic rest Diet: routine Medications: Ibuprofen and Percocet Condition: improved Instructions: refer to practice specific booklet Discharge to: home Follow-up Information   Follow up with Logan Bores, MD. Schedule an appointment as soon as possible for a visit in 2 weeks. (incision check)    Specialty:  Obstetrics and Gynecology   Contact information:   [address], [place] [address] [phone]       Newborn Data: Live born female  Birth Weight: 7 lb 9.5 oz (3445 g) APGAR: 8, 9  Home with mother.  Logan Bores 08/11/2013, 11:09 AM

## 2013-08-10 NOTE — Progress Notes (Signed)
Subjective: Postpartum Day 1  Cesarean Delivery Patient reports incisional pain, tolerating PO and no problems voiding.    Objective: Vital signs in last 24 hours: Temp:  [97.5 F (36.4 C)-98.2 F (36.8 C)] 97.5 F (36.4 C) (01/03 0520) Pulse Rate:  [56-108] 85 (01/03 0520) Resp:  [15-21] 18 (01/03 0520) BP: (111-128)/(62-97) 113/70 mmHg (01/03 0520) SpO2:  [96 %-100 %] 97 % (01/03 0520) Weight:  [68.493 kg (151 lb)] 68.493 kg (151 lb) (01/02 1523)  Physical Exam:  General: alert and cooperative Lochia: appropriate Uterine Fundus: firm Incision: C/D/I    Recent Labs  08/10/13 0647  HGB 9.7*  HCT 29.0*    Assessment/Plan: Status post Cesarean section. Doing well postoperatively.  Continue current care.  Logan Bores 08/10/2013, 9:43 AM

## 2013-08-11 MED ORDER — IBUPROFEN 600 MG PO TABS: 600.0000 mg | ORAL_TABLET | Freq: Four times a day (QID) | ORAL | Status: DC

## 2013-08-11 MED ORDER — OXYCODONE-ACETAMINOPHEN 5-325 MG PO TABS: 1.0000 | ORAL_TABLET | ORAL | Status: DC | PRN

## 2013-08-11 NOTE — Progress Notes (Signed)
Subjective: Postpartum Day 2: Cesarean Delivery Patient reports incisional pain, tolerating PO and no problems voiding.    Objective: Vital signs in last 24 hours: Temp:  [97.9 F (36.6 C)-98.5 F (36.9 C)] 97.9 F (36.6 C) (01/04 0544) Pulse Rate:  [75-84] 84 (01/04 0544) Resp:  [18-20] 18 (01/04 0544) BP: (104-109)/(64-70) 109/64 mmHg (01/04 0544) SpO2:  [98 %] 98 % (01/04 0544)  Physical Exam:  General: alert and cooperative Lochia: appropriate Uterine Fundus: firm Incision: C/D/I    Recent Labs  08/10/13 0647  HGB 9.7*  HCT 29.0*    Assessment/Plan: Status post Cesarean section. Doing well postoperatively.  Discharge home with standard precautions and return to clinic 2 weeks.  Logan Bores 08/11/2013, 11:06 AM

## 2013-08-12 ENCOUNTER — Encounter (HOSPITAL_COMMUNITY): Payer: Self-pay | Admitting: Obstetrics and Gynecology

## 2013-10-14 ENCOUNTER — Other Ambulatory Visit: Payer: Self-pay | Admitting: Dermatology

## 2014-06-09 ENCOUNTER — Encounter (HOSPITAL_COMMUNITY): Payer: Self-pay | Admitting: Obstetrics and Gynecology

## 2014-08-19 ENCOUNTER — Other Ambulatory Visit: Payer: Self-pay | Admitting: Dermatology

## 2018-05-31 ENCOUNTER — Ambulatory Visit (INDEPENDENT_AMBULATORY_CARE_PROVIDER_SITE_OTHER): Payer: PRIVATE HEALTH INSURANCE | Admitting: Gastroenterology

## 2018-05-31 ENCOUNTER — Encounter: Payer: Self-pay | Admitting: Gastroenterology

## 2018-05-31 ENCOUNTER — Other Ambulatory Visit (INDEPENDENT_AMBULATORY_CARE_PROVIDER_SITE_OTHER): Payer: PRIVATE HEALTH INSURANCE

## 2018-05-31 VITALS — BP 102/78 | HR 101 | Ht 62.0 in | Wt 129.4 lb

## 2018-05-31 DIAGNOSIS — R110 Nausea: Secondary | ICD-10-CM | POA: Diagnosis not present

## 2018-05-31 DIAGNOSIS — R1013 Epigastric pain: Secondary | ICD-10-CM

## 2018-05-31 LAB — H. PYLORI ANTIBODY, IGG: H Pylori IgG: NEGATIVE

## 2018-05-31 NOTE — Patient Instructions (Signed)
If you are age 39 or older, your body mass index should be between 23-30. Your Body mass index is 23.66 kg/m. If this is out of the aforementioned range listed, please consider follow up with your Primary Care Provider.  If you are age 28 or younger, your body mass index should be between 19-25. Your Body mass index is 23.66 kg/m. If this is out of the aformentioned range listed, please consider follow up with your Primary Care Provider.   Please go to the lab on the 2nd floor [address] before you leave the office today to have some lab work drawn.   It was a pleasure to see you today!  Vito Cirigliano, D.O.

## 2018-05-31 NOTE — Progress Notes (Signed)
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 Sxs started approx 2 weeks ago. Sxs started after lunch, becoming more frequent over the 2 weeks essentially occurring after every meal.  Symptoms occurred within a few hours of eating, regardless of type of food.  No association with activity.  No preceding antibiotic exposure, travel, hospitalization, change of medications. No change with bland diet.Started OTC omeprazole 20 mg daily without appreciable change, so she added famotidine BID, now with some improvement.  Also takes Tums as needed. Nausea without emesis. No fever.  No previous similar symptoms.  No prior history of PUD.  No prior EGD.  She does have a history of migraines with perhaps one migraine over the last couple of weeks requiring sumatriptan for abortive therapy.  Does not recall associated abdominal pain at that same time.  She does endorse some fear of eating due to provocation of symptoms.  No history of vascular disease.  Otherwise no fever, chills, night sweats, hematochezia, melena, change in bowel habits.  FHx n/f PGF with CRC with mets to liver, mother with colon polyps, father/brother with HH.   Outside labs from 11/2017 reviewed and n/f: normal CBC, CMP.  No recent abdominal imaging for review  Past Medical History:  Diagnosis Date  . Headache(784.0)    migraine  . History of HPV infection   . Hx of varicella   . Noncompliant pregnant patient    35wk     Past Surgical History:  Procedure Laterality Date  . BREAST SURGERY     aug  . CESAREAN SECTION N/A 08/09/2013   Procedure: CESAREAN SECTION;  Surgeon: Logan Bores, MD;  Location: [facility];  Service: Obstetrics;  Laterality: N/A;  1 1/2hrs OR time  . COLPOSCOPY    . EYE SURGERY     lasik   Family History    Problem Relation Age of Onset  . Thyroid disease Father   . Hypothyroidism Father   . Thyroid disease Maternal Grandmother   . Arthritis Paternal Grandmother        rheumatoid  . Liver cancer Paternal Grandfather   . Colon cancer Paternal Grandfather   . Migraines Mother   . Colonic polyp Mother   . Esophageal cancer Neg Hx    Social History   Tobacco Use  . Smoking status: Never Smoker  . Smokeless tobacco: Never Used  Substance Use Topics  . Alcohol use: Yes    Comment: once a month  . Drug use: No   Current Outpatient Medications  Medication Sig Dispense Refill  . famotidine (PEPCID) 20 MG tablet Take 20 mg by mouth 2 (two) times daily.    . Galcanezumab-gnlm (EMGALITY) 120 MG/ML SOAJ every 30 (thirty) days.    . Magnesium 250 MG TABS Take 1 tablet by mouth daily.    Marland Kitchen omeprazole (PRILOSEC) 20 MG capsule Take 20 mg by mouth daily.    . SUMAtriptan (IMITREX) 100 MG tablet 1 tablet as needed.     No current facility-administered medications for this visit.    No Known Allergies   Review of Systems: All systems reviewed and negative except where noted in HPI.     Physical Exam:    Wt Readings from  Last 3 Encounters:  05/31/18 129 lb 6 oz (58.7 kg)  08/09/13 151 lb (68.5 kg)  08/07/13 151 lb (68.5 kg)    BP 102/78   Pulse (!) 101   Ht 5\' 2"  (1.575 m)   Wt 129 lb 6 oz (58.7 kg)   BMI 23.66 kg/m  Constitutional:  Pleasant, in no acute distress. Psychiatric: Normal mood and affect. Behavior is normal. EENT: Pupils normal.  Conjunctivae are normal. No scleral icterus. Neck supple. No cervical LAD. Cardiovascular: Normal rate, regular rhythm. No edema Pulmonary/chest: Effort normal and breath sounds normal. No wheezing, rales or rhonchi. Abdominal: Soft, nondistended, nontender. Bowel sounds active throughout. There are no masses palpable. No hepatomegaly. Neurological: Alert and oriented to person place and time. Skin: Skin is warm and dry. No rashes  noted.   ASSESSMENT AND PLAN;   Kylie Lee is a 37 y.o. female presenting with recent onset MEG pain and nausea without emesis.  1) MEG pain: Discussed the DDX for her acute onset symptoms to include PUD, gastritis, nonulcer dyspepsia, etc. Location of pain seems less consistent with biliary etiology, although atypical presentation is in DDX.  Less likely is vascular etiology (i.e. celiac artery compression syndrome/MALS), or intestinal migraine.  Discussed evaluation for mucosal/luminal etiology with EGD but she prefers to evaluate with labs and ongoing medical management as below.  Given clinical presentation and patient reliability to return if worsening/ongoing symptoms, agree with this plan and will proceed as below:  - Check H. pylori for test and treat -Increase omeprazole to 20 mg twice daily with plan for 4 weeks then to titrate to off -Resume famotidine twice daily for now also with plan for short course - The non-chronic nature seems inconsistent with intestinal migraine, although if no clinical improvement can consider trial of triptan with symptoms for diagnostic and therapeutic intent - If no clinical improvement/worsening symptoms, patient to contact me and will schedule for EGD.  Can also consider RUQ ultrasound and/or cross-sectional imaging if no clinical improvement.  2) Nausea: Related to the above abdominal pain.  Otherwise no emesis.  Will treat underlying abdominal pain as above.  I spent a total of 30 minutes of face-to-face time with the patient. Greater than 50% of the time was spent counseling and coordinating care.    Lavena Bullion, DO, FACG  05/31/2018, 10:10 AM   Darcus Austin, MD

## 2018-12-19 ENCOUNTER — Ambulatory Visit (INDEPENDENT_AMBULATORY_CARE_PROVIDER_SITE_OTHER): Payer: No Typology Code available for payment source | Admitting: Psychology

## 2018-12-19 DIAGNOSIS — F4322 Adjustment disorder with anxiety: Secondary | ICD-10-CM

## 2019-01-09 ENCOUNTER — Ambulatory Visit (INDEPENDENT_AMBULATORY_CARE_PROVIDER_SITE_OTHER): Payer: No Typology Code available for payment source | Admitting: Psychology

## 2019-01-09 DIAGNOSIS — F4322 Adjustment disorder with anxiety: Secondary | ICD-10-CM

## 2019-01-23 ENCOUNTER — Ambulatory Visit (INDEPENDENT_AMBULATORY_CARE_PROVIDER_SITE_OTHER): Payer: No Typology Code available for payment source | Admitting: Psychology

## 2019-01-23 DIAGNOSIS — F4322 Adjustment disorder with anxiety: Secondary | ICD-10-CM | POA: Diagnosis not present

## 2019-02-07 ENCOUNTER — Ambulatory Visit (INDEPENDENT_AMBULATORY_CARE_PROVIDER_SITE_OTHER): Payer: No Typology Code available for payment source | Admitting: Psychology

## 2019-02-07 DIAGNOSIS — F4322 Adjustment disorder with anxiety: Secondary | ICD-10-CM | POA: Diagnosis not present

## 2019-02-20 ENCOUNTER — Ambulatory Visit (INDEPENDENT_AMBULATORY_CARE_PROVIDER_SITE_OTHER): Payer: No Typology Code available for payment source | Admitting: Psychology

## 2019-02-20 DIAGNOSIS — F4322 Adjustment disorder with anxiety: Secondary | ICD-10-CM

## 2019-03-06 ENCOUNTER — Ambulatory Visit (INDEPENDENT_AMBULATORY_CARE_PROVIDER_SITE_OTHER): Payer: No Typology Code available for payment source | Admitting: Psychology

## 2019-03-06 DIAGNOSIS — F4322 Adjustment disorder with anxiety: Secondary | ICD-10-CM | POA: Diagnosis not present

## 2019-03-20 ENCOUNTER — Ambulatory Visit: Payer: No Typology Code available for payment source | Admitting: Psychology

## 2019-04-03 ENCOUNTER — Ambulatory Visit (INDEPENDENT_AMBULATORY_CARE_PROVIDER_SITE_OTHER): Payer: No Typology Code available for payment source | Admitting: Psychology

## 2019-04-03 DIAGNOSIS — F4322 Adjustment disorder with anxiety: Secondary | ICD-10-CM

## 2019-05-01 ENCOUNTER — Ambulatory Visit (INDEPENDENT_AMBULATORY_CARE_PROVIDER_SITE_OTHER): Payer: No Typology Code available for payment source | Admitting: Psychology

## 2019-05-01 DIAGNOSIS — F4322 Adjustment disorder with anxiety: Secondary | ICD-10-CM

## 2019-05-08 ENCOUNTER — Other Ambulatory Visit: Payer: Self-pay | Admitting: Obstetrics and Gynecology

## 2019-05-08 DIAGNOSIS — N6320 Unspecified lump in the left breast, unspecified quadrant: Secondary | ICD-10-CM

## 2019-05-15 ENCOUNTER — Ambulatory Visit
Admission: RE | Admit: 2019-05-15 | Discharge: 2019-05-15 | Disposition: A | Discharge status: Home / Self Care | Payer: PRIVATE HEALTH INSURANCE | Source: Ambulatory Visit | Attending: Obstetrics and Gynecology | Admitting: Obstetrics and Gynecology

## 2019-05-15 ENCOUNTER — Other Ambulatory Visit: Payer: Self-pay | Admitting: Obstetrics and Gynecology

## 2019-05-15 ENCOUNTER — Other Ambulatory Visit: Payer: Self-pay

## 2019-05-15 DIAGNOSIS — N6320 Unspecified lump in the left breast, unspecified quadrant: Secondary | ICD-10-CM

## 2019-05-22 ENCOUNTER — Ambulatory Visit (INDEPENDENT_AMBULATORY_CARE_PROVIDER_SITE_OTHER): Payer: No Typology Code available for payment source | Admitting: Psychology

## 2019-05-22 DIAGNOSIS — F4322 Adjustment disorder with anxiety: Secondary | ICD-10-CM | POA: Diagnosis not present

## 2019-06-12 ENCOUNTER — Ambulatory Visit (INDEPENDENT_AMBULATORY_CARE_PROVIDER_SITE_OTHER): Payer: No Typology Code available for payment source | Admitting: Psychology

## 2019-06-12 DIAGNOSIS — F4322 Adjustment disorder with anxiety: Secondary | ICD-10-CM

## 2019-07-03 ENCOUNTER — Ambulatory Visit (INDEPENDENT_AMBULATORY_CARE_PROVIDER_SITE_OTHER): Payer: No Typology Code available for payment source | Admitting: Psychology

## 2019-07-03 DIAGNOSIS — F4322 Adjustment disorder with anxiety: Secondary | ICD-10-CM

## 2019-07-24 ENCOUNTER — Ambulatory Visit (INDEPENDENT_AMBULATORY_CARE_PROVIDER_SITE_OTHER): Payer: No Typology Code available for payment source | Admitting: Psychology

## 2019-07-24 DIAGNOSIS — F4322 Adjustment disorder with anxiety: Secondary | ICD-10-CM | POA: Diagnosis not present

## 2019-08-21 ENCOUNTER — Ambulatory Visit (INDEPENDENT_AMBULATORY_CARE_PROVIDER_SITE_OTHER): Payer: No Typology Code available for payment source | Admitting: Psychology

## 2019-08-21 DIAGNOSIS — F4322 Adjustment disorder with anxiety: Secondary | ICD-10-CM

## 2019-09-17 ENCOUNTER — Ambulatory Visit (INDEPENDENT_AMBULATORY_CARE_PROVIDER_SITE_OTHER): Payer: 59 | Admitting: Psychology

## 2019-09-17 DIAGNOSIS — F4322 Adjustment disorder with anxiety: Secondary | ICD-10-CM | POA: Diagnosis not present

## 2019-09-18 DIAGNOSIS — E0590 Thyrotoxicosis, unspecified without thyrotoxic crisis or storm: Secondary | ICD-10-CM | POA: Diagnosis not present

## 2019-09-18 DIAGNOSIS — F418 Other specified anxiety disorders: Secondary | ICD-10-CM | POA: Diagnosis not present

## 2019-09-18 DIAGNOSIS — Z0000 Encounter for general adult medical examination without abnormal findings: Secondary | ICD-10-CM | POA: Diagnosis not present

## 2019-10-10 DIAGNOSIS — R899 Unspecified abnormal finding in specimens from other organs, systems and tissues: Secondary | ICD-10-CM | POA: Diagnosis not present

## 2019-10-15 ENCOUNTER — Ambulatory Visit (INDEPENDENT_AMBULATORY_CARE_PROVIDER_SITE_OTHER): Payer: 59 | Admitting: Psychology

## 2019-10-15 DIAGNOSIS — F4322 Adjustment disorder with anxiety: Secondary | ICD-10-CM

## 2019-11-12 ENCOUNTER — Ambulatory Visit: Payer: No Typology Code available for payment source | Admitting: Psychology

## 2019-12-12 ENCOUNTER — Ambulatory Visit (INDEPENDENT_AMBULATORY_CARE_PROVIDER_SITE_OTHER): Payer: 59 | Admitting: Psychology

## 2019-12-12 DIAGNOSIS — F4322 Adjustment disorder with anxiety: Secondary | ICD-10-CM

## 2020-02-11 ENCOUNTER — Ambulatory Visit (INDEPENDENT_AMBULATORY_CARE_PROVIDER_SITE_OTHER): Payer: 59 | Admitting: Psychology

## 2020-02-11 DIAGNOSIS — F4322 Adjustment disorder with anxiety: Secondary | ICD-10-CM

## 2020-04-04 DIAGNOSIS — Z20822 Contact with and (suspected) exposure to covid-19: Secondary | ICD-10-CM | POA: Diagnosis not present

## 2020-05-19 DIAGNOSIS — Z23 Encounter for immunization: Secondary | ICD-10-CM | POA: Diagnosis not present

## 2020-06-01 DIAGNOSIS — Z1389 Encounter for screening for other disorder: Secondary | ICD-10-CM | POA: Diagnosis not present

## 2020-06-01 DIAGNOSIS — Z6824 Body mass index (BMI) 24.0-24.9, adult: Secondary | ICD-10-CM | POA: Diagnosis not present

## 2020-06-01 DIAGNOSIS — Z1231 Encounter for screening mammogram for malignant neoplasm of breast: Secondary | ICD-10-CM | POA: Diagnosis not present

## 2020-06-01 DIAGNOSIS — Z130 Encounter for screening for diseases of the blood and blood-forming organs and certain disorders involving the immune mechanism: Secondary | ICD-10-CM | POA: Diagnosis not present

## 2020-06-01 DIAGNOSIS — Z01419 Encounter for gynecological examination (general) (routine) without abnormal findings: Secondary | ICD-10-CM | POA: Diagnosis not present

## 2020-06-15 DIAGNOSIS — D2239 Melanocytic nevi of other parts of face: Secondary | ICD-10-CM | POA: Diagnosis not present

## 2020-06-15 DIAGNOSIS — L700 Acne vulgaris: Secondary | ICD-10-CM | POA: Diagnosis not present

## 2020-06-15 DIAGNOSIS — D225 Melanocytic nevi of trunk: Secondary | ICD-10-CM | POA: Diagnosis not present

## 2020-06-15 DIAGNOSIS — D2261 Melanocytic nevi of right upper limb, including shoulder: Secondary | ICD-10-CM | POA: Diagnosis not present

## 2020-06-15 DIAGNOSIS — D1801 Hemangioma of skin and subcutaneous tissue: Secondary | ICD-10-CM | POA: Diagnosis not present

## 2020-06-15 DIAGNOSIS — D2262 Melanocytic nevi of left upper limb, including shoulder: Secondary | ICD-10-CM | POA: Diagnosis not present

## 2020-06-15 DIAGNOSIS — D2272 Melanocytic nevi of left lower limb, including hip: Secondary | ICD-10-CM | POA: Diagnosis not present

## 2020-06-16 ENCOUNTER — Encounter: Payer: Self-pay | Admitting: Obstetrics and Gynecology

## 2020-09-11 IMAGING — MG MM DIGITAL DIAGNOSTIC UNILAT*L* W/ TOMO W/ CAD
4 series · 4 of 12 positions shown · non-contrast
Comparison: Previous exam(s).

CLINICAL DATA: Patient presents as a recall from screening for a
possible left breast mass.

EXAM:
DIGITAL DIAGNOSTIC UNILATERAL LEFT MAMMOGRAM WITH CAD AND TOMO

[L CC synth-2D]
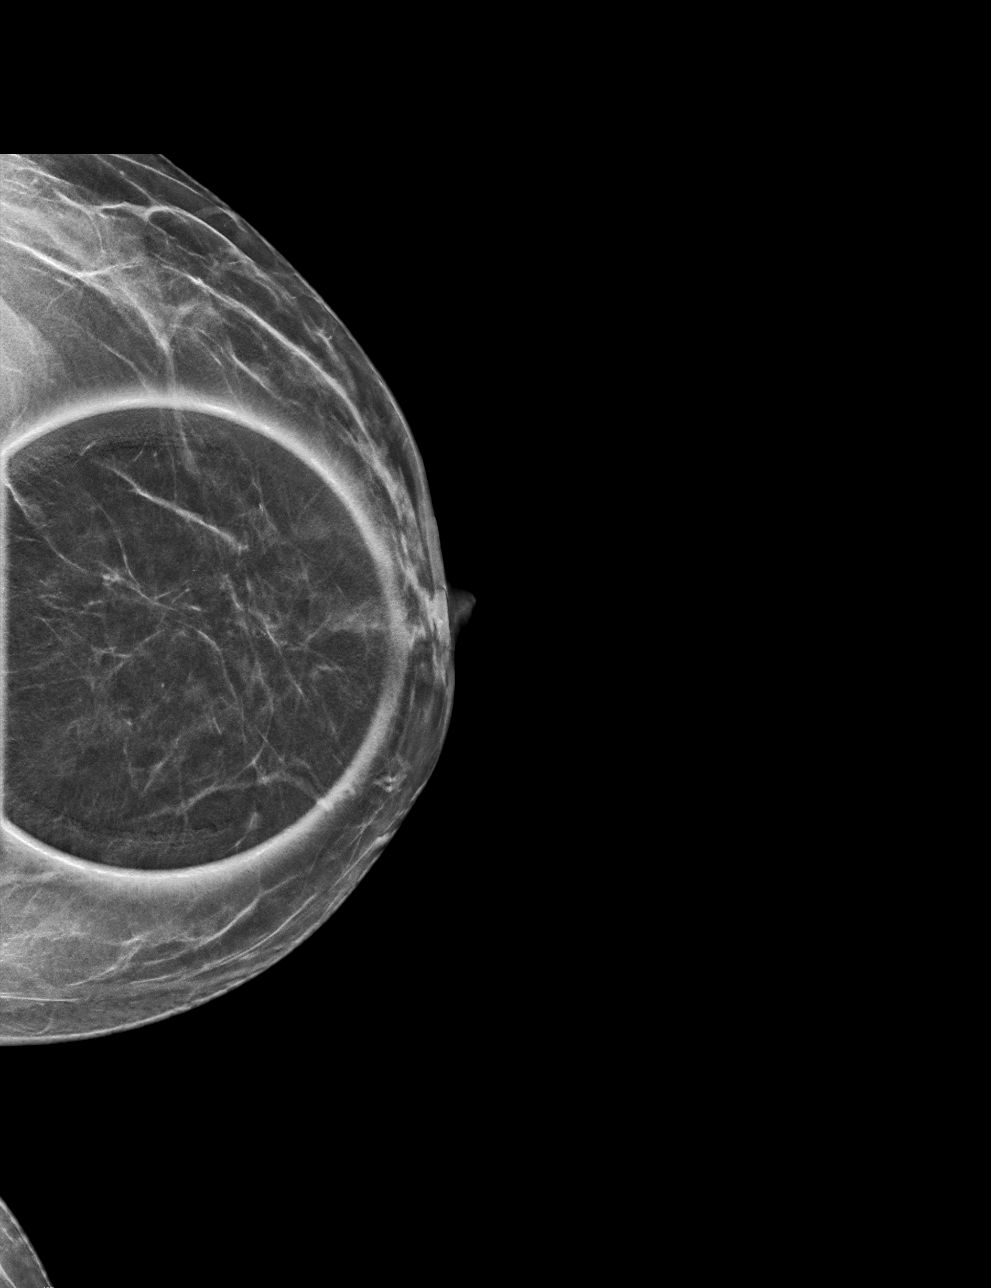

[L ML synth-2D]
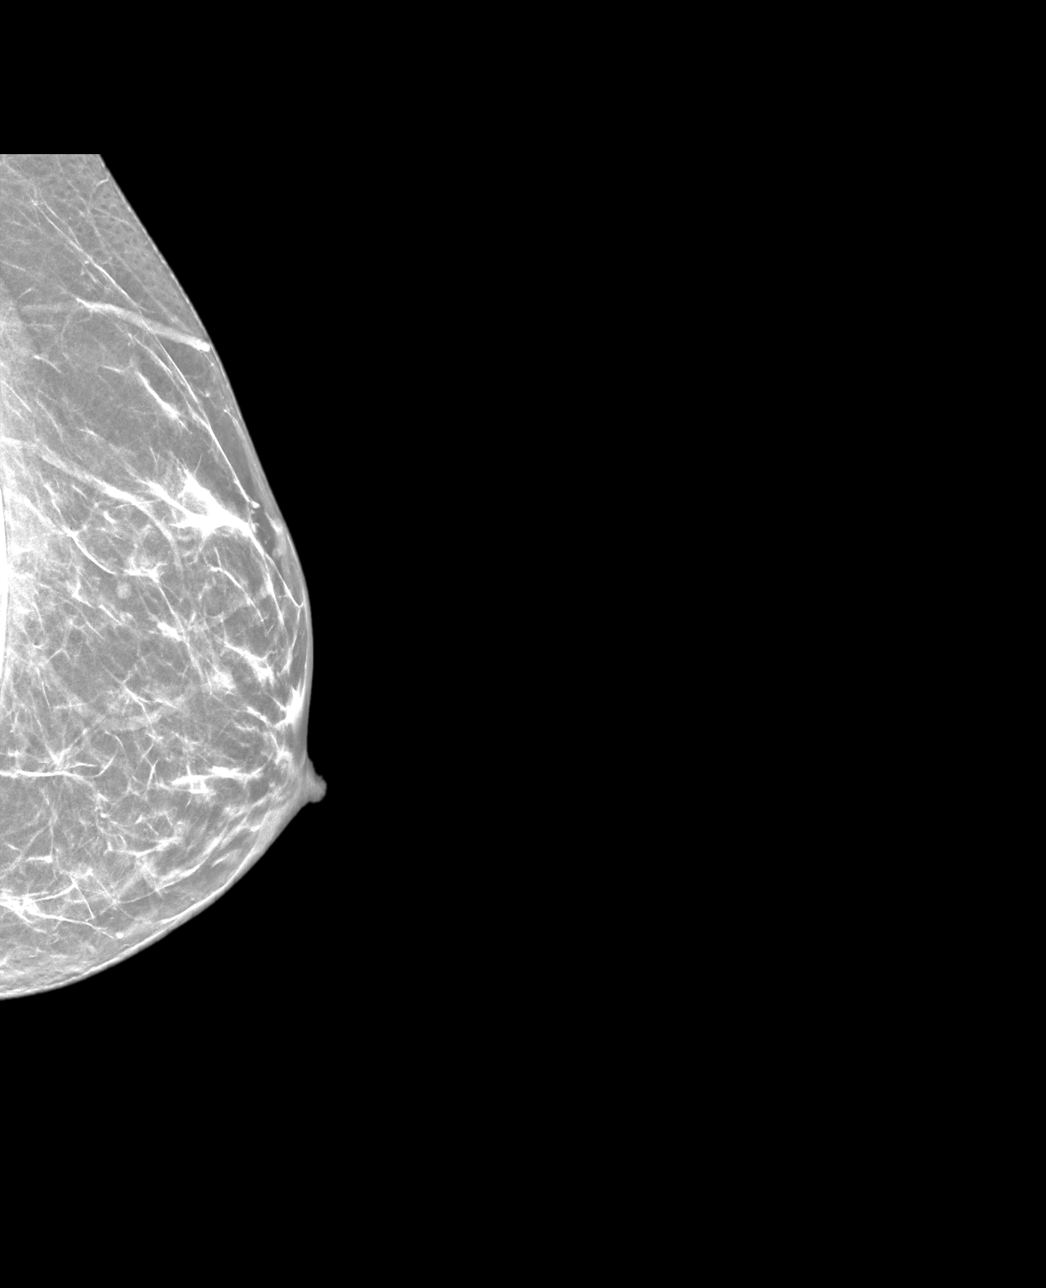

[L ML tomo · tomo slice 28/55.0]
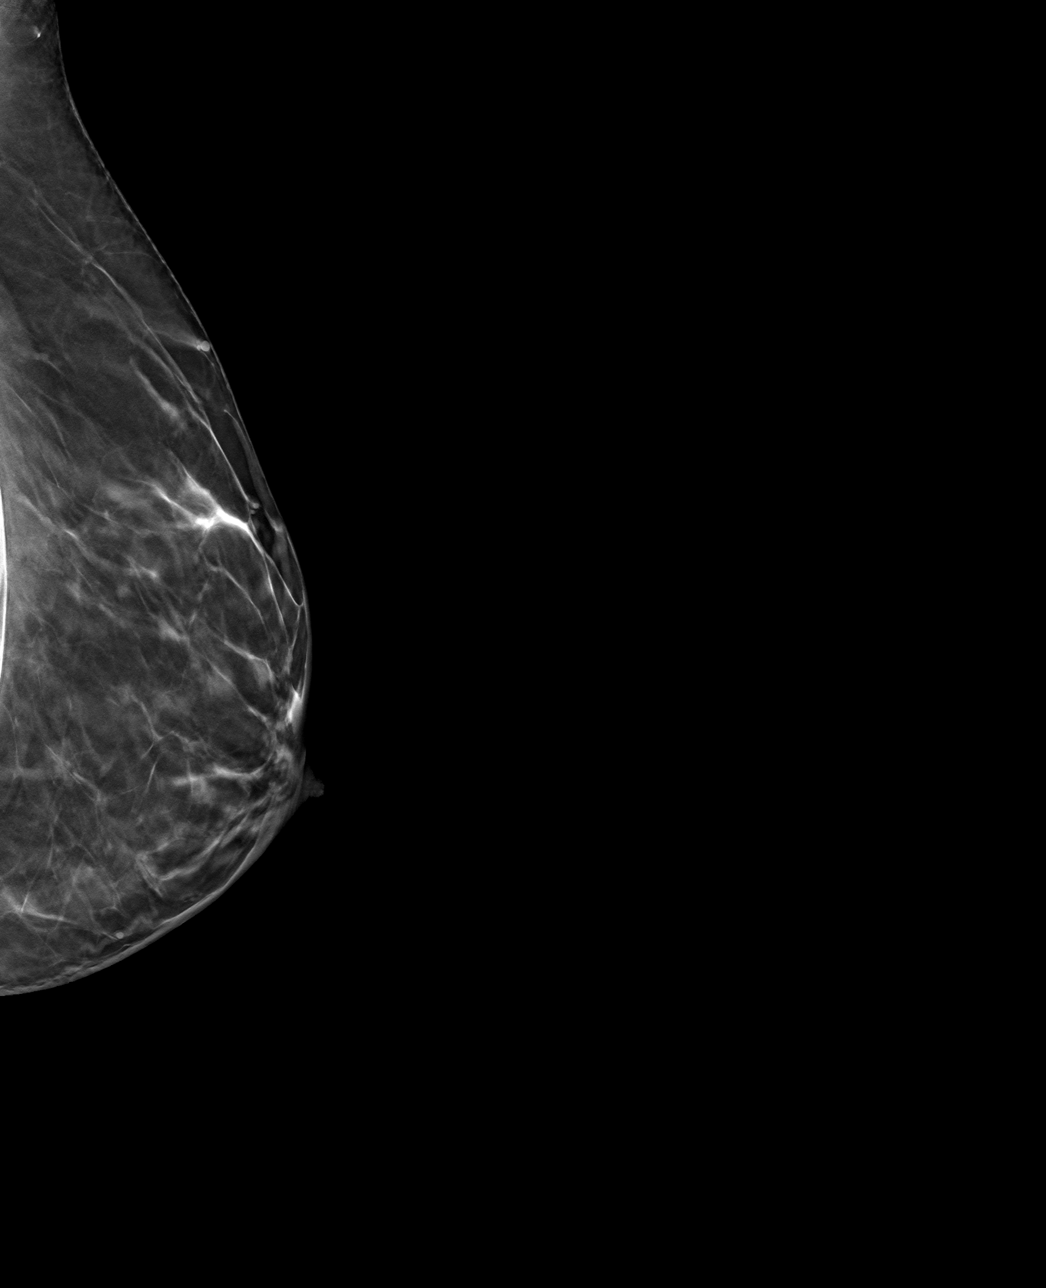

[L CC tomo · tomo slice 23/44.0]
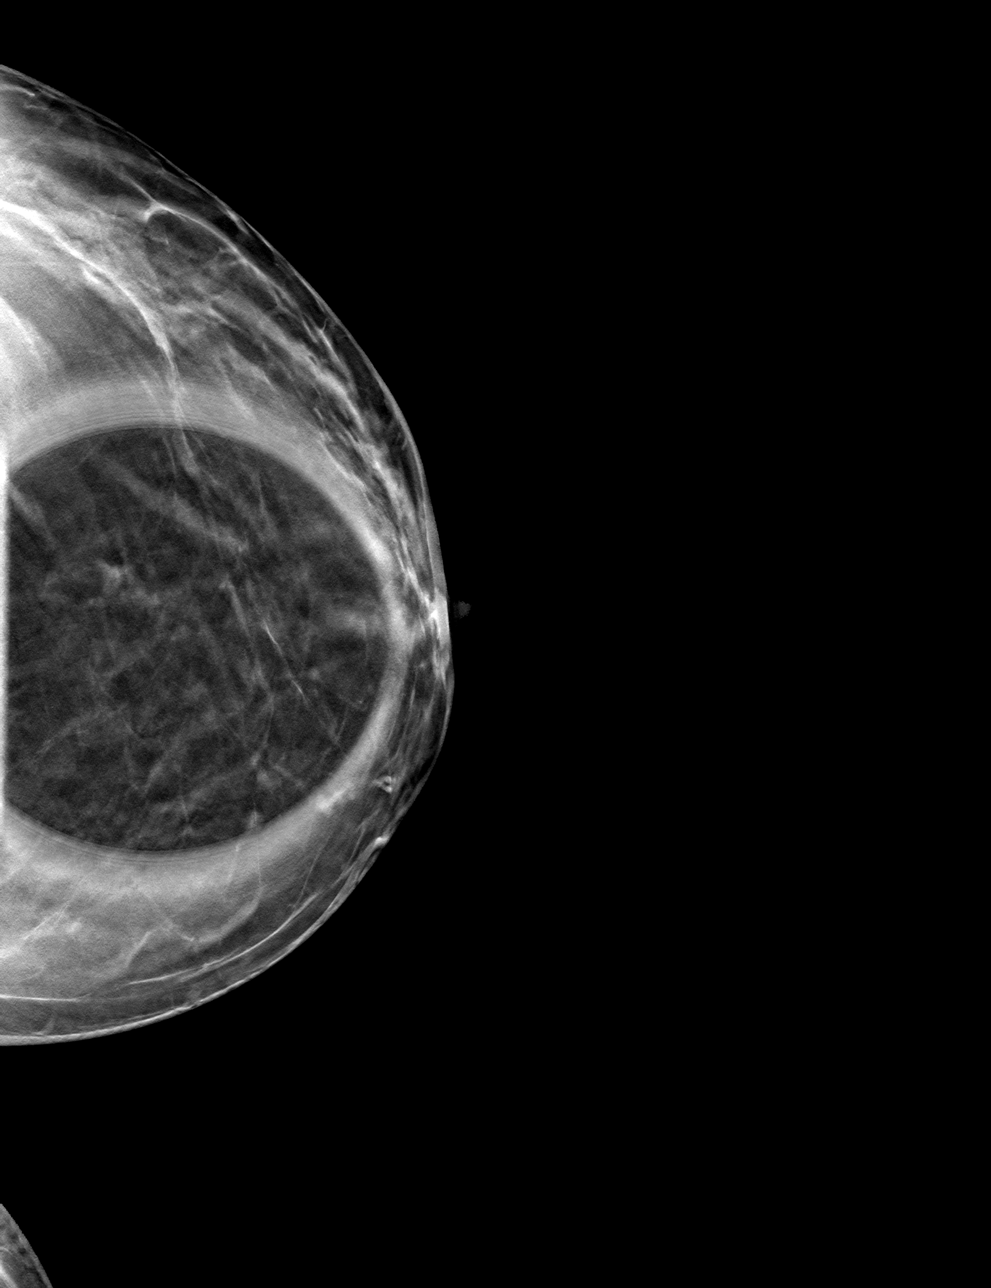

[4 of 12 positions shown; findings below may reference images not displayed]

ACR Breast Density Category b: There are scattered areas of
fibroglandular density.
FINDINGS: Mammogram: Additional spot compression tomosynthesis views were
performed for the possible mass in the left breast. The additional
imaging demonstrates persistence of a 4 mm oval mass in the medial
left breast.

Mammographic images were processed with CAD.

Ultrasound: Targeted ultrasound was performed at 10 o'clock 4 cm
from the nipple in the left breast demonstrating an oval
circumscribed anechoic mass measuring 0.3 x 0.2 x 0.4 cm consistent
with a simple cyst. This corresponds to the mammographic finding.
IMPRESSION: Left breast mass at 10 o'clock measuring 0.4 cm is consistent with a
benign simple cyst.

RECOMMENDATION:
Screening mammogram in one year.(Code:[id number])

I have discussed the findings and recommendations with the patient.
If applicable, a reminder letter will be sent to the patient
regarding the next appointment.

BI-RADS CATEGORY  2: Benign.

## 2020-09-21 ENCOUNTER — Other Ambulatory Visit (HOSPITAL_COMMUNITY): Payer: Self-pay | Admitting: Nurse Practitioner

## 2020-09-21 DIAGNOSIS — L700 Acne vulgaris: Secondary | ICD-10-CM | POA: Diagnosis not present

## 2020-09-21 DIAGNOSIS — Z13220 Encounter for screening for lipoid disorders: Secondary | ICD-10-CM | POA: Diagnosis not present

## 2020-09-21 DIAGNOSIS — G43009 Migraine without aura, not intractable, without status migrainosus: Secondary | ICD-10-CM | POA: Diagnosis not present

## 2020-09-21 DIAGNOSIS — Z0000 Encounter for general adult medical examination without abnormal findings: Secondary | ICD-10-CM | POA: Diagnosis not present

## 2020-09-21 MED FILL — KETOROLAC TROMETHAMINE 60 M: 60 | 5 days supply | Qty: 20 | Fill #0

## 2020-09-21 MED FILL — SUMAtriptan SUCCINATE 100 M: 100 | 30 days supply | Qty: 9 | Fill #0

## 2020-11-09 ENCOUNTER — Other Ambulatory Visit (HOSPITAL_COMMUNITY): Payer: Self-pay

## 2020-11-09 ENCOUNTER — Other Ambulatory Visit (INDEPENDENT_AMBULATORY_CARE_PROVIDER_SITE_OTHER): Payer: 59 | Admitting: Sports Medicine

## 2020-11-09 ENCOUNTER — Other Ambulatory Visit: Payer: Self-pay | Admitting: *Deleted

## 2020-11-09 DIAGNOSIS — R200 Anesthesia of skin: Secondary | ICD-10-CM

## 2020-11-09 MED ORDER — MELOXICAM 15 MG PO TABS
15.0000 mg | ORAL_TABLET | ORAL | 0 refills | Status: DC
  Filled 2020-11-09: qty 40, 40d supply, fill #0

## 2020-11-09 MED ORDER — MELOXICAM 15 MG PO TABS: ORAL_TABLET | ORAL | 0 refills | Status: DC

## 2020-11-09 NOTE — Progress Notes (Signed)
   Subjective:    Patient ID: Kylie Lee, female    DOB: 13-Feb-1981, 40 y.o.   MRN: [id number]  HPI chief complaint: Left leg numbness  Kylie Lee comes in today complaining of 2 weeks of left lower leg and foot numbness.  She initially noticed numbness in her foot while walking after work.  She thought it may be secondary to the shoes that she was using, but her numbness persisted even when putting on different shoes.  Her numbness has been consistent for the past 2 weeks and is not affected by prolonged sitting, walking, or standing.  A few days ago, she began to develop numbness along the lateral left leg and now has numbness in the posterior left hip.  No numbness in the thigh.  She denies any pain.  She has not noticed any weakness.  No similar issues in the past.  No symptoms in the right leg.  She denies difficulty with balance, gait, or speech.  She had her B12 levels checked which were within normal limits but she went ahead and started some B12 for presumed neuropathy.  No change in bowel or bladder habits.  Neurological history is significant only for migraines, but she did also have an episode of numbness in her tongue and loss of taste last summer which she initially contributed to Covid. A Covid test at that time was negative.  The numbness initially resolved on the left side of her tongue and then the right.  This occurred over about a 4-week time span.    Past medical history reviewed.  She is otherwise healthy No known drug allergies    Review of Systems As above    Objective:   Physical Exam  Well-developed, fit appearing.  No acute distress.  Neurological exam: Strength is 5/5 in both lower extremities.  Sensation is decreased to light touch in the L5 dermatome on the left along both the lateral left leg and the foot.  No atrophy.  Left patellar reflex is 1-2/4 compared to 3+/4 on the right.  Bilateral Achilles reflexes are 3+/4.  No atrophy.  Good pulses.      Assessment  & Plan:   2 weeks of left leg numbness likely secondary to lumbar radiculopathy  Kylie Lee's dermatomal pattern seems to fit the L5 nerve root distribution.  She also has a diminished patellar reflex on the left compared to the right.  Her lack of pain and weakness are reassuring.  I would like to start meloxicam 15 mg daily for the next week.  I think she may continue with her activities of daily living without restriction and she may continue with her normal workout routine which consists primarily of the elliptical and upper body strengthening.  If symptoms persist or worsen despite 1 week of NSAIDs, then I would consider a lumbar spine MRI to evaluate further for a bulging lumbar disc or, alternatively, a referral to neurology (Dr. Jaynee Eagles).

## 2020-11-09 NOTE — Addendum Note (Signed)
Addended by: Cyd Silence on: 11/09/2020 05:04 PM   Modules accepted: Orders

## 2020-11-10 ENCOUNTER — Other Ambulatory Visit (HOSPITAL_COMMUNITY): Payer: Self-pay

## 2020-12-06 MED FILL — Sumatriptan Succinate Tab 100 MG: ORAL | 30 days supply | Qty: 9 | Fill #0 | Status: AC

## 2020-12-07 ENCOUNTER — Other Ambulatory Visit (HOSPITAL_COMMUNITY): Payer: Self-pay

## 2020-12-08 ENCOUNTER — Other Ambulatory Visit (HOSPITAL_COMMUNITY): Payer: Self-pay

## 2021-01-13 ENCOUNTER — Other Ambulatory Visit (HOSPITAL_COMMUNITY): Payer: Self-pay

## 2021-01-19 DIAGNOSIS — L700 Acne vulgaris: Secondary | ICD-10-CM | POA: Diagnosis not present

## 2021-02-01 ENCOUNTER — Other Ambulatory Visit (HOSPITAL_COMMUNITY): Payer: Self-pay

## 2021-02-01 MED FILL — Sumatriptan Succinate Tab 100 MG: ORAL | 30 days supply | Qty: 9 | Fill #1 | Status: AC

## 2021-02-04 ENCOUNTER — Other Ambulatory Visit (HOSPITAL_COMMUNITY): Payer: Self-pay

## 2021-03-04 ENCOUNTER — Other Ambulatory Visit (HOSPITAL_COMMUNITY): Payer: Self-pay

## 2021-03-04 MED FILL — Sumatriptan Succinate Tab 100 MG: ORAL | 30 days supply | Qty: 9 | Fill #2 | Status: AC

## 2021-03-31 ENCOUNTER — Other Ambulatory Visit (HOSPITAL_BASED_OUTPATIENT_CLINIC_OR_DEPARTMENT_OTHER): Payer: Self-pay

## 2021-03-31 ENCOUNTER — Other Ambulatory Visit (HOSPITAL_BASED_OUTPATIENT_CLINIC_OR_DEPARTMENT_OTHER): Payer: Self-pay | Admitting: Obstetrics and Gynecology

## 2021-03-31 DIAGNOSIS — Z1231 Encounter for screening mammogram for malignant neoplasm of breast: Secondary | ICD-10-CM

## 2021-04-02 ENCOUNTER — Other Ambulatory Visit (HOSPITAL_COMMUNITY): Payer: Self-pay

## 2021-04-02 MED FILL — Sumatriptan Succinate Tab 100 MG: ORAL | 30 days supply | Qty: 9 | Fill #3 | Status: AC

## 2021-05-05 MED FILL — Sumatriptan Succinate Tab 100 MG: ORAL | 30 days supply | Qty: 9 | Fill #4 | Status: AC

## 2021-05-06 ENCOUNTER — Other Ambulatory Visit (HOSPITAL_COMMUNITY): Payer: Self-pay

## 2021-05-20 DIAGNOSIS — Z23 Encounter for immunization: Secondary | ICD-10-CM | POA: Diagnosis not present

## 2021-05-31 DIAGNOSIS — Z0100 Encounter for examination of eyes and vision without abnormal findings: Secondary | ICD-10-CM | POA: Diagnosis not present

## 2021-06-01 ENCOUNTER — Encounter (HOSPITAL_BASED_OUTPATIENT_CLINIC_OR_DEPARTMENT_OTHER): Payer: Self-pay

## 2021-06-01 ENCOUNTER — Other Ambulatory Visit: Payer: Self-pay

## 2021-06-01 ENCOUNTER — Ambulatory Visit (HOSPITAL_BASED_OUTPATIENT_CLINIC_OR_DEPARTMENT_OTHER)
Admission: RE | Admit: 2021-06-01 | Discharge: 2021-06-01 | Disposition: A | Discharge status: Home / Self Care | Payer: 59 | Source: Ambulatory Visit | Attending: Obstetrics and Gynecology | Admitting: Obstetrics and Gynecology

## 2021-06-01 DIAGNOSIS — Z1231 Encounter for screening mammogram for malignant neoplasm of breast: Secondary | ICD-10-CM | POA: Insufficient documentation

## 2021-06-02 DIAGNOSIS — Z130 Encounter for screening for diseases of the blood and blood-forming organs and certain disorders involving the immune mechanism: Secondary | ICD-10-CM | POA: Diagnosis not present

## 2021-06-02 DIAGNOSIS — Z6823 Body mass index (BMI) 23.0-23.9, adult: Secondary | ICD-10-CM | POA: Diagnosis not present

## 2021-06-02 DIAGNOSIS — Z1389 Encounter for screening for other disorder: Secondary | ICD-10-CM | POA: Diagnosis not present

## 2021-06-02 DIAGNOSIS — Z01419 Encounter for gynecological examination (general) (routine) without abnormal findings: Secondary | ICD-10-CM | POA: Diagnosis not present

## 2021-06-07 ENCOUNTER — Other Ambulatory Visit (HOSPITAL_COMMUNITY): Payer: Self-pay

## 2021-06-07 MED FILL — Sumatriptan Succinate Tab 100 MG: ORAL | 30 days supply | Qty: 9 | Fill #5 | Status: AC

## 2021-06-17 DIAGNOSIS — Z01419 Encounter for gynecological examination (general) (routine) without abnormal findings: Secondary | ICD-10-CM | POA: Diagnosis not present

## 2021-06-25 DIAGNOSIS — R000 Tachycardia, unspecified: Secondary | ICD-10-CM | POA: Diagnosis not present

## 2021-07-06 ENCOUNTER — Other Ambulatory Visit (HOSPITAL_COMMUNITY): Payer: Self-pay

## 2021-07-06 MED FILL — Sumatriptan Succinate Tab 100 MG: ORAL | 30 days supply | Qty: 9 | Fill #6 | Status: AC

## 2021-07-12 DIAGNOSIS — D2272 Melanocytic nevi of left lower limb, including hip: Secondary | ICD-10-CM | POA: Diagnosis not present

## 2021-07-12 DIAGNOSIS — L918 Other hypertrophic disorders of the skin: Secondary | ICD-10-CM | POA: Diagnosis not present

## 2021-07-12 DIAGNOSIS — L700 Acne vulgaris: Secondary | ICD-10-CM | POA: Diagnosis not present

## 2021-07-12 DIAGNOSIS — L821 Other seborrheic keratosis: Secondary | ICD-10-CM | POA: Diagnosis not present

## 2021-07-12 DIAGNOSIS — D225 Melanocytic nevi of trunk: Secondary | ICD-10-CM | POA: Diagnosis not present

## 2021-07-22 DIAGNOSIS — L03213 Periorbital cellulitis: Secondary | ICD-10-CM | POA: Diagnosis not present

## 2021-07-22 DIAGNOSIS — Z8639 Personal history of other endocrine, nutritional and metabolic disease: Secondary | ICD-10-CM | POA: Diagnosis not present

## 2021-07-22 DIAGNOSIS — R002 Palpitations: Secondary | ICD-10-CM | POA: Diagnosis not present

## 2021-07-22 DIAGNOSIS — Z20822 Contact with and (suspected) exposure to covid-19: Secondary | ICD-10-CM | POA: Diagnosis not present

## 2021-07-22 DIAGNOSIS — F418 Other specified anxiety disorders: Secondary | ICD-10-CM | POA: Diagnosis not present

## 2021-07-22 DIAGNOSIS — G4700 Insomnia, unspecified: Secondary | ICD-10-CM | POA: Diagnosis not present

## 2021-07-28 DIAGNOSIS — R000 Tachycardia, unspecified: Secondary | ICD-10-CM | POA: Diagnosis not present

## 2021-08-10 DIAGNOSIS — L700 Acne vulgaris: Secondary | ICD-10-CM | POA: Diagnosis not present

## 2021-08-10 DIAGNOSIS — Z79899 Other long term (current) drug therapy: Secondary | ICD-10-CM | POA: Diagnosis not present

## 2021-08-11 ENCOUNTER — Other Ambulatory Visit (HOSPITAL_COMMUNITY): Payer: Self-pay

## 2021-08-11 MED FILL — Sumatriptan Succinate Tab 100 MG: ORAL | 30 days supply | Qty: 9 | Fill #7 | Status: AC

## 2021-08-12 ENCOUNTER — Other Ambulatory Visit (HOSPITAL_COMMUNITY): Payer: Self-pay

## 2021-08-27 DIAGNOSIS — N926 Irregular menstruation, unspecified: Secondary | ICD-10-CM | POA: Diagnosis not present

## 2021-08-27 DIAGNOSIS — R634 Abnormal weight loss: Secondary | ICD-10-CM | POA: Diagnosis not present

## 2021-09-20 ENCOUNTER — Other Ambulatory Visit (HOSPITAL_COMMUNITY): Payer: Self-pay

## 2021-09-20 DIAGNOSIS — G43009 Migraine without aura, not intractable, without status migrainosus: Secondary | ICD-10-CM | POA: Diagnosis not present

## 2021-09-20 DIAGNOSIS — R630 Anorexia: Secondary | ICD-10-CM | POA: Diagnosis not present

## 2021-09-20 DIAGNOSIS — R002 Palpitations: Secondary | ICD-10-CM | POA: Diagnosis not present

## 2021-09-20 DIAGNOSIS — Z0000 Encounter for general adult medical examination without abnormal findings: Secondary | ICD-10-CM | POA: Diagnosis not present

## 2021-09-20 DIAGNOSIS — Z8639 Personal history of other endocrine, nutritional and metabolic disease: Secondary | ICD-10-CM | POA: Diagnosis not present

## 2021-09-20 MED ORDER — DAPSONE 7.5 % EX GEL
1.0000 "application " | Freq: Every day | CUTANEOUS | 1 refills | Status: DC
  Filled 2021-09-20: qty 60, 30d supply, fill #0
  Filled 2021-10-28: qty 90, 30d supply, fill #0
  Filled 2022-03-27 – 2022-04-18 (×2): qty 90, 30d supply, fill #1

## 2021-09-20 MED ORDER — ESCITALOPRAM OXALATE 10 MG PO TABS
10.0000 mg | ORAL_TABLET | Freq: Every day | ORAL | 5 refills | Status: DC
  Filled 2021-09-20: qty 30, 30d supply, fill #0
  Filled 2021-10-20: qty 30, 30d supply, fill #1
  Filled 2021-11-28: qty 30, 30d supply, fill #2
  Filled 2022-02-18: qty 30, 30d supply, fill #3
  Filled 2022-04-18: qty 30, 30d supply, fill #4
  Filled 2022-07-02: qty 30, 30d supply, fill #5

## 2021-09-20 MED ORDER — SUMATRIPTAN SUCCINATE 100 MG PO TABS
100.0000 mg | ORAL_TABLET | ORAL | 11 refills | Status: DC | PRN
  Filled 2021-09-20: qty 9, 30d supply, fill #0
  Filled 2021-10-20: qty 9, 30d supply, fill #1
  Filled 2021-11-28: qty 9, 30d supply, fill #2
  Filled 2022-01-02: qty 9, 30d supply, fill #3
  Filled 2022-02-18: qty 9, 30d supply, fill #4
  Filled 2022-03-27 – 2022-04-18 (×2): qty 9, 30d supply, fill #5
  Filled 2022-07-02: qty 9, 30d supply, fill #6
  Filled 2022-09-11: qty 9, 30d supply, fill #7

## 2021-09-24 ENCOUNTER — Other Ambulatory Visit (HOSPITAL_COMMUNITY): Payer: Self-pay

## 2021-10-01 DIAGNOSIS — N926 Irregular menstruation, unspecified: Secondary | ICD-10-CM | POA: Diagnosis not present

## 2021-10-01 DIAGNOSIS — L700 Acne vulgaris: Secondary | ICD-10-CM | POA: Diagnosis not present

## 2021-10-11 ENCOUNTER — Ambulatory Visit (INDEPENDENT_AMBULATORY_CARE_PROVIDER_SITE_OTHER): Payer: 59 | Admitting: Psychology

## 2021-10-11 DIAGNOSIS — F4322 Adjustment disorder with anxiety: Secondary | ICD-10-CM | POA: Diagnosis not present
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
## 2021-10-20 ENCOUNTER — Other Ambulatory Visit (HOSPITAL_COMMUNITY): Payer: Self-pay

## 2021-10-25 ENCOUNTER — Ambulatory Visit: Payer: 59 | Admitting: Psychology

## 2021-10-28 ENCOUNTER — Other Ambulatory Visit (HOSPITAL_COMMUNITY): Payer: Self-pay

## 2021-10-29 ENCOUNTER — Other Ambulatory Visit (HOSPITAL_COMMUNITY): Payer: Self-pay

## 2021-11-01 ENCOUNTER — Other Ambulatory Visit (HOSPITAL_COMMUNITY): Payer: Self-pay

## 2021-11-08 ENCOUNTER — Ambulatory Visit (INDEPENDENT_AMBULATORY_CARE_PROVIDER_SITE_OTHER): Payer: 59 | Admitting: Psychology

## 2021-11-08 DIAGNOSIS — F411 Generalized anxiety disorder: Secondary | ICD-10-CM

## 2021-11-08 NOTE — Progress Notes (Signed)
[facility] Counselor/Therapist Progress Note ? ?Patient ID: ANAIKA SANTILLANO, MRN: [id number],   ? ?Date: 11/08/2021 ? ?Time Spent: 3:00pm - 3:55pm   55 minutes  ? ?Treatment Type: Individual Therapy ? ?Reported Symptoms: anxiety ? ?Mental Status Exam: ?Appearance:  Casual     ?Behavior: Appropriate  ?Motor: Normal  ?Speech/Language:  Normal Rate  ?Affect: Appropriate  ?Mood: normal  ?Thought process: normal  ?Thought content:   WNL  ?Sensory/Perceptual disturbances:   WNL  ?Orientation: oriented to person, place, time/date, and situation  ?Attention: Good  ?Concentration: Good  ?Memory: WNL  ?Fund of knowledge:  Good  ?Insight:   Good  ?Judgment:  Good  ?Impulse Control: Good  ? ?Risk Assessment: ?Danger to Self:  No ?Self-injurious Behavior: No ?Danger to Others: No ?Duty to Warn:no ?Physical Aggression / Violence:No  ?Access to Firearms a concern: No  ?Gang Involvement:No  ? ?Subjective:  Pt present for face-to-face individual therapy via video Webex.  Pt consents to telehealth video session due to COVID 19 pandemic. ?Location of pt: home ?Location of therapist: home office.  ?Pt talked about her anxiety.  She has woken up some in the middle of the night but has been able to get to sleep. ?Pt has had some anxiety about her heart rate and use to track it constantly but has made some changes to not monitor at night and only periodically during the day.    ?Pt read the book that this therapist recommended about intrusive thoughts and found it helpful.  She is practicing the interventions that are recommended.   Addressed additional strategies for dealing with intrusive thoughts.   ?Pt talked about spring break next week.  Pt and family will go to [place] to [place] [employer].  She is excited about it.   ?Pt's youngest son had lice last week.  He and the whole family got lice and it was very stressful for pt.   Addressed how pt coped with it. ?Provided supportive therapy.    ? ?Interventions:  Cognitive Behavioral Therapy and Insight-Oriented ? ?Diagnosis: F41.1 ? ?Plan:  ?Recommend ongoing therapy.   Pt participated in setting treatment goals.  Plan to meet every two weeks.  Pt is progressing toward treatment goals.  ? ?Treatment Plan (Treatment plan target date:  10/12/2022) ?Client Abilities/Strengths  ?Pt is bright, engaging and motivated for therapy.  ?Client Treatment Preferences  ?Individual therapy.  ?Client Statement of Needs  ?Improve coping skills.  ?Symptoms  ?Autonomic hyperactivity (e.g., palpitations, shortness of breath, dry mouth, trouble swallowing, nausea, diarrhea). Excessive and/or unrealistic worry that is difficult to control occurring more days than not for at least 6 months about a number of events or activities. Hypervigilance (e.g., feeling constantly on edge, experiencing concentration difficulties, having trouble falling or staying asleep, exhibiting a general state of irritability). Motor tension (e.g., restlessness, tiredness, shakiness, muscle tension). ?Problems Addressed  ?Anxiety ?Goals ?1. Enhance ability to effectively cope with the full variety of life's worries and anxieties. ?2. Learn and implement coping skills that result in a reduction of anxiety and worry, and improved daily functioning. ?Objective ?Learn to accept limitations in life and commit to tolerating, rather than avoiding, unpleasant emotions while accomplishing meaningful goals. ?Target Date: 2022-10-12 Frequency: Biweekly ?Progress: 30 Modality: individual ?Related Interventions ?1. Use techniques from Acceptance and Commitment Therapy to help client accept uncomfortable realities such as lack of complete control, imperfections, and uncertainty and tolerate unpleasant emotions and thoughts in order to accomplish value-consistent goals. ?Objective ?Learn  and implement problem-solving strategies for realistically addressing worries. ?Target Date: 2022-10-12 Frequency: Biweekly ?Progress: 30 Modality:  individual ?Related Interventions ?1. Assign the client a homework exercise in which he/she problem-solves a current problem.  review, reinforce success, and provide corrective feedback toward improvement. ?2. Teach the client problem-solving strategies involving specifically defining a problem, generating options for addressing it, evaluating the pros and cons of each option, selecting and implementing an optional action, and reevaluating and refining the action. ?Objective ?Learn and implement calming skills to reduce overall anxiety and manage anxiety symptoms. ?Target Date: 2022-10-12 Frequency: Biweekly ?Progress: 30 Modality: individual ?Related Interventions ?1. Assign the client to read about progressive muscle relaxation and other calming strategies in relevant books or treatment manuals (e.g., Progressive Relaxation Training by Gwynneth Aliment and Dani Gobble; Mastery of Your Anxiety and Worry: Workbook by Beckie Busing). ?2. Assign the client homework each session in which he/she practices relaxation exercises daily, gradually applying them progressively from non-anxiety-provoking to anxiety-provoking situations; review and reinforce success while providing corrective feedback toward improvement. ?3. Teach the client calming/relaxation skills (e.g., applied relaxation, progressive muscle relaxation, cue controlled relaxation; mindful breathing; biofeedback) and how to discriminate better between relaxation and tension; teach the client how to apply these skills to his/her daily life. ?3. Reduce overall frequency, intensity, and duration of the anxiety so that daily functioning is not impaired. ?4. Resolve the core conflict that is the source of anxiety. ?5. Stabilize anxiety level while increasing ability to function on a daily basis. ?Diagnosis :    F41.1  Generalized Anxiety Disorder  ?Conditions For Discharge ?Achievement of treatment goals and objectives. ? ?Litzi Binning, LCSW ? ? ? ?

## 2021-11-29 ENCOUNTER — Other Ambulatory Visit (HOSPITAL_COMMUNITY): Payer: Self-pay

## 2021-12-13 ENCOUNTER — Ambulatory Visit (INDEPENDENT_AMBULATORY_CARE_PROVIDER_SITE_OTHER): Payer: 59 | Admitting: Psychology

## 2021-12-13 DIAGNOSIS — F411 Generalized anxiety disorder: Secondary | ICD-10-CM | POA: Diagnosis not present

## 2021-12-13 NOTE — Progress Notes (Signed)
[facility] Counselor/Therapist Progress Note ? ?Patient ID: Kylie Lee, MRN: [id number],   ? ?Date: 12/13/2021 ? ?Time Spent: 3:00pm - 3:55pm   55 minutes  ? ?Treatment Type: Individual Therapy ? ?Reported Symptoms: anxiety ? ?Mental Status Exam: ?Appearance:  Casual     ?Behavior: Appropriate  ?Motor: Normal  ?Speech/Language:  Normal Rate  ?Affect: Appropriate  ?Mood: normal  ?Thought process: normal  ?Thought content:   WNL  ?Sensory/Perceptual disturbances:   WNL  ?Orientation: oriented to person, place, time/date, and situation  ?Attention: Good  ?Concentration: Good  ?Memory: WNL  ?Fund of knowledge:  Good  ?Insight:   Good  ?Judgment:  Good  ?Impulse Control: Good  ? ?Risk Assessment: ?Danger to Self:  No ?Self-injurious Behavior: No ?Danger to Others: No ?Duty to Warn:no ?Physical Aggression / Violence:No  ?Access to Firearms a concern: No  ?Gang Involvement:No  ? ?Subjective:  Pt present for face-to-face individual therapy via video Webex.  Pt consents to telehealth video session due to COVID 19 pandemic. ?Location of pt: home ?Location of therapist: home office.  ?Pt talked about her vacation to [place].  The family had a good time. ?Pt and husband are going to [place] to [employer].  Kylie Lee's father died a year ago.  Addressed how devastating his death was .  Addressed pt's and the family's grief. ?Pt talked about her intrusive thoughts.  She has had less intrusive thoughts lately.  Pt has been using the strategies recommended.   ?Pt talked about the stress of preparing for her brother's wedding in July.    ?Provided supportive therapy.    ? ?Interventions: Cognitive Behavioral Therapy and Insight-Oriented ? ?Diagnosis: F41.1 ? ?Plan:  ?Recommend ongoing therapy.   Pt participated in setting treatment goals.  Plan to meet every two weeks.  Pt is progressing toward treatment goals.  ? ?Treatment Plan (Treatment plan target date:  10/12/2022) ?Client  Abilities/Strengths  ?Pt is bright, engaging and motivated for therapy.  ?Client Treatment Preferences  ?Individual therapy.  ?Client Statement of Needs  ?Improve coping skills.  ?Symptoms  ?Autonomic hyperactivity (e.g., palpitations, shortness of breath, dry mouth, trouble swallowing, nausea, diarrhea). Excessive and/or unrealistic worry that is difficult to control occurring more days than not for at least 6 months about a number of events or activities. Hypervigilance (e.g., feeling constantly on edge, experiencing concentration difficulties, having trouble falling or staying asleep, exhibiting a general state of irritability). Motor tension (e.g., restlessness, tiredness, shakiness, muscle tension). ?Problems Addressed  ?Anxiety ?Goals ?1. Enhance ability to effectively cope with the full variety of life's worries and anxieties. ?2. Learn and implement coping skills that result in a reduction of anxiety and worry, and improved daily functioning. ?Objective ?Learn to accept limitations in life and commit to tolerating, rather than avoiding, unpleasant emotions while accomplishing meaningful goals. ?Target Date: 2022-10-12 Frequency: Biweekly ?Progress: 30 Modality: individual ?Related Interventions ?1. Use techniques from Acceptance and Commitment Therapy to help client accept uncomfortable realities such as lack of complete control, imperfections, and uncertainty and tolerate unpleasant emotions and thoughts in order to accomplish value-consistent goals. ?Objective ?Learn and implement problem-solving strategies for realistically addressing worries. ?Target Date: 2022-10-12 Frequency: Biweekly ?Progress: 30 Modality: individual ?Related Interventions ?1. Assign the client a homework exercise in which he/she problem-solves a current problem.  review, reinforce success, and provide corrective feedback toward improvement. ?2. Teach the client problem-solving strategies involving specifically defining a problem,  generating options for addressing it, evaluating the pros and  cons of each option, selecting and implementing an optional action, and reevaluating and refining the action. ?Objective ?Learn and implement calming skills to reduce overall anxiety and manage anxiety symptoms. ?Target Date: 2022-10-12 Frequency: Biweekly ?Progress: 30 Modality: individual ?Related Interventions ?1. Assign the client to read about progressive muscle relaxation and other calming strategies in relevant books or treatment manuals (e.g., Progressive Relaxation Training by Gwynneth Aliment and Dani Gobble; Mastery of Your Anxiety and Worry: Workbook by Beckie Busing). ?2. Assign the client homework each session in which he/she practices relaxation exercises daily, gradually applying them progressively from non-anxiety-provoking to anxiety-provoking situations; review and reinforce success while providing corrective feedback toward improvement. ?3. Teach the client calming/relaxation skills (e.g., applied relaxation, progressive muscle relaxation, cue controlled relaxation; mindful breathing; biofeedback) and how to discriminate better between relaxation and tension; teach the client how to apply these skills to his/her daily life. ?3. Reduce overall frequency, intensity, and duration of the anxiety so that daily functioning is not impaired. ?4. Resolve the core conflict that is the source of anxiety. ?5. Stabilize anxiety level while increasing ability to function on a daily basis. ?Diagnosis :    F41.1  Generalized Anxiety Disorder  ?Conditions For Discharge ?Achievement of treatment goals and objectives. ? ?Mackenzy Grumbine, LCSW ? ? ? ?

## 2022-01-04 ENCOUNTER — Other Ambulatory Visit (HOSPITAL_COMMUNITY): Payer: Self-pay

## 2022-01-11 DIAGNOSIS — H0289 Other specified disorders of eyelid: Secondary | ICD-10-CM | POA: Diagnosis not present

## 2022-01-11 DIAGNOSIS — H0014 Chalazion left upper eyelid: Secondary | ICD-10-CM | POA: Diagnosis not present

## 2022-01-24 ENCOUNTER — Ambulatory Visit (INDEPENDENT_AMBULATORY_CARE_PROVIDER_SITE_OTHER): Payer: 59 | Admitting: Psychology

## 2022-01-24 DIAGNOSIS — F411 Generalized anxiety disorder: Secondary | ICD-10-CM

## 2022-01-24 NOTE — Progress Notes (Signed)
[facility] Counselor/Therapist Progress Note  Patient ID: Kylie Lee, MRN: [id number],    Date: 01/24/2022  Time Spent: 3:00pm - 3:55pm   55 minutes   Treatment Type: Individual Therapy  Reported Symptoms: anxiety  Mental Status Exam: Appearance:  Casual     Behavior: Appropriate  Motor: Normal  Speech/Language:  Normal Rate  Affect: Appropriate  Mood: normal  Thought process: normal  Thought content:   WNL  Sensory/Perceptual disturbances:   WNL  Orientation: oriented to person, place, time/date, and situation  Attention: Good  Concentration: Good  Memory: WNL  Fund of knowledge:  Good  Insight:   Good  Judgment:  Good  Impulse Control: Good   Risk Assessment: Danger to Self:  No Self-injurious Behavior: No Danger to Others: No Duty to Warn:no Physical Aggression / Violence:No  Access to Firearms a concern: No  Gang Involvement:No   Subjective:  Pt present for face-to-face individual therapy via video Webex.  Pt consents to telehealth video session due to COVID 19 pandemic. Location of pt: home Location of therapist: home office.  Pt talked about work.  Things have been extra busy the past few days.   Addressed work stress.  Pt talked about an issue with a patient whose family pt knows well.  The patient's husband died suddenly at their son's wedding.   Pt and family traveled to [place] to bury pt's husband's father's ashes.   Pt states it was very emotional.  It was good to see family and get some closure.  Yesterday Father's Day was hard for [employer].  Pt did a good job supporting her husband.  Pt talked about her mother being very anxious bc pt's brother's wedding is in a couple of weeks.  Pt's mother is having a hard time relinquishing control.  Pt's mother confides in pt a lot bc they are very close.   Pt states her anxiety has continued to improve.   Her mood has been stable.   She feels like she is at a good place now and can transition to  having therapy sessions as needed.   Provided supportive therapy.     Interventions: Cognitive Behavioral Therapy and Insight-Oriented  Diagnosis: F41.1  Plan:  Recommend ongoing therapy.   Pt participated in setting treatment goals.  Plan to meet every two weeks.  Pt is progressing toward treatment goals.   Treatment Plan (Treatment plan target date:  10/12/2022) Client Abilities/Strengths  Pt is bright, engaging and motivated for therapy.  Client Treatment Preferences  Individual therapy.  Client Statement of Needs  Improve coping skills.  Symptoms  Autonomic hyperactivity (e.g., palpitations, shortness of breath, dry mouth, trouble swallowing, nausea, diarrhea). Excessive and/or unrealistic worry that is difficult to control occurring more days than not for at least 6 months about a number of events or activities. Hypervigilance (e.g., feeling constantly on edge, experiencing concentration difficulties, having trouble falling or staying asleep, exhibiting a general state of irritability). Motor tension (e.g., restlessness, tiredness, shakiness, muscle tension). Problems Addressed  Anxiety Goals 1. Enhance ability to effectively cope with the full variety of life's worries and anxieties. 2. Learn and implement coping skills that result in a reduction of anxiety and worry, and improved daily functioning. Objective Learn to accept limitations in life and commit to tolerating, rather than avoiding, unpleasant emotions while accomplishing meaningful goals. Target Date: 2022-10-12 Frequency: Biweekly Progress: 30 Modality: individual Related Interventions 1. Use techniques from Acceptance and Commitment Therapy to help client accept uncomfortable realities such  as lack of complete control, imperfections, and uncertainty and tolerate unpleasant emotions and thoughts in order to accomplish value-consistent goals. Objective Learn and implement problem-solving strategies for realistically  addressing worries. Target Date: 2022-10-12 Frequency: Biweekly Progress: 30 Modality: individual Related Interventions 1. Assign the client a homework exercise in which he/she problem-solves a current problem.  review, reinforce success, and provide corrective feedback toward improvement. 2. Teach the client problem-solving strategies involving specifically defining a problem, generating options for addressing it, evaluating the pros and cons of each option, selecting and implementing an optional action, and reevaluating and refining the action. Objective Learn and implement calming skills to reduce overall anxiety and manage anxiety symptoms. Target Date: 2022-10-12 Frequency: Biweekly Progress: 30 Modality: individual Related Interventions 1. Assign the client to read about progressive muscle relaxation and other calming strategies in relevant books or treatment manuals (e.g., Progressive Relaxation Training by Gwynneth Aliment and Dani Gobble; Mastery of Your Anxiety and Worry: Workbook by Beckie Busing). 2. Assign the client homework each session in which he/she practices relaxation exercises daily, gradually applying them progressively from non-anxiety-provoking to anxiety-provoking situations; review and reinforce success while providing corrective feedback toward improvement. 3. Teach the client calming/relaxation skills (e.g., applied relaxation, progressive muscle relaxation, cue controlled relaxation; mindful breathing; biofeedback) and how to discriminate better between relaxation and tension; teach the client how to apply these skills to his/her daily life. 3. Reduce overall frequency, intensity, and duration of the anxiety so that daily functioning is not impaired. 4. Resolve the core conflict that is the source of anxiety. 5. Stabilize anxiety level while increasing ability to function on a daily basis. Diagnosis :    F41.1  Generalized Anxiety Disorder  Conditions For  Discharge Achievement of treatment goals and objectives.  Abdul Beirne, LCSW

## 2022-02-18 ENCOUNTER — Other Ambulatory Visit (HOSPITAL_COMMUNITY): Payer: Self-pay

## 2022-03-28 ENCOUNTER — Other Ambulatory Visit (HOSPITAL_COMMUNITY): Payer: Self-pay

## 2022-03-29 ENCOUNTER — Other Ambulatory Visit (HOSPITAL_COMMUNITY): Payer: Self-pay

## 2022-04-05 ENCOUNTER — Other Ambulatory Visit (HOSPITAL_COMMUNITY): Payer: Self-pay

## 2022-04-13 ENCOUNTER — Other Ambulatory Visit (HOSPITAL_COMMUNITY): Payer: Self-pay

## 2022-04-18 ENCOUNTER — Other Ambulatory Visit (HOSPITAL_COMMUNITY): Payer: Self-pay

## 2022-04-21 ENCOUNTER — Other Ambulatory Visit (HOSPITAL_COMMUNITY): Payer: Self-pay

## 2022-04-22 ENCOUNTER — Other Ambulatory Visit (HOSPITAL_COMMUNITY): Payer: Self-pay

## 2022-05-27 DIAGNOSIS — Z23 Encounter for immunization: Secondary | ICD-10-CM | POA: Diagnosis not present

## 2022-06-28 DIAGNOSIS — Z130 Encounter for screening for diseases of the blood and blood-forming organs and certain disorders involving the immune mechanism: Secondary | ICD-10-CM | POA: Diagnosis not present

## 2022-06-28 DIAGNOSIS — Z01419 Encounter for gynecological examination (general) (routine) without abnormal findings: Secondary | ICD-10-CM | POA: Diagnosis not present

## 2022-06-28 DIAGNOSIS — Z1231 Encounter for screening mammogram for malignant neoplasm of breast: Secondary | ICD-10-CM | POA: Diagnosis not present

## 2022-06-28 DIAGNOSIS — Z1389 Encounter for screening for other disorder: Secondary | ICD-10-CM | POA: Diagnosis not present

## 2022-07-02 ENCOUNTER — Other Ambulatory Visit (HOSPITAL_COMMUNITY): Payer: Self-pay

## 2022-09-11 ENCOUNTER — Other Ambulatory Visit (HOSPITAL_COMMUNITY): Payer: Self-pay

## 2022-09-12 ENCOUNTER — Other Ambulatory Visit (HOSPITAL_COMMUNITY): Payer: Self-pay

## 2022-09-12 ENCOUNTER — Other Ambulatory Visit: Payer: Self-pay

## 2022-09-12 DIAGNOSIS — L700 Acne vulgaris: Secondary | ICD-10-CM | POA: Diagnosis not present

## 2022-09-12 DIAGNOSIS — D225 Melanocytic nevi of trunk: Secondary | ICD-10-CM | POA: Diagnosis not present

## 2022-09-12 DIAGNOSIS — D2262 Melanocytic nevi of left upper limb, including shoulder: Secondary | ICD-10-CM | POA: Diagnosis not present

## 2022-09-12 DIAGNOSIS — D485 Neoplasm of uncertain behavior of skin: Secondary | ICD-10-CM | POA: Diagnosis not present

## 2022-09-12 DIAGNOSIS — D2272 Melanocytic nevi of left lower limb, including hip: Secondary | ICD-10-CM | POA: Diagnosis not present

## 2022-09-12 DIAGNOSIS — D2261 Melanocytic nevi of right upper limb, including shoulder: Secondary | ICD-10-CM | POA: Diagnosis not present

## 2022-09-12 DIAGNOSIS — L821 Other seborrheic keratosis: Secondary | ICD-10-CM | POA: Diagnosis not present

## 2022-09-12 DIAGNOSIS — L570 Actinic keratosis: Secondary | ICD-10-CM | POA: Diagnosis not present

## 2022-09-12 DIAGNOSIS — L918 Other hypertrophic disorders of the skin: Secondary | ICD-10-CM | POA: Diagnosis not present

## 2022-09-12 DIAGNOSIS — L723 Sebaceous cyst: Secondary | ICD-10-CM | POA: Diagnosis not present

## 2022-09-12 MED ORDER — DAPSONE 7.5 % EX GEL
1.0000 | Freq: Every evening | CUTANEOUS | 2 refills | Status: DC
  Filled 2022-09-12: qty 60, 30d supply, fill #0
  Filled 2022-11-27: qty 60, 30d supply, fill #1
  Filled 2023-01-07: qty 60, 30d supply, fill #2

## 2022-09-12 MED ORDER — AZELAIC ACID 15 % EX GEL
1.0000 | Freq: Every morning | CUTANEOUS | 2 refills | Status: DC
  Filled 2022-09-12: qty 50, 30d supply, fill #0
  Filled 2022-11-27: qty 50, 30d supply, fill #1
  Filled 2023-01-07: qty 50, 30d supply, fill #2

## 2022-09-12 MED ORDER — ESCITALOPRAM OXALATE 10 MG PO TABS
10.0000 mg | ORAL_TABLET | Freq: Every day | ORAL | 5 refills | Status: DC
  Filled 2022-09-12: qty 30, 30d supply, fill #0
  Filled 2023-08-12: qty 30, 30d supply, fill #1

## 2022-09-30 ENCOUNTER — Other Ambulatory Visit (HOSPITAL_COMMUNITY): Payer: Self-pay

## 2022-09-30 MED ORDER — DAPSONE 7.5 % EX GEL
1.0000 | Freq: Every day | CUTANEOUS | 1 refills | Status: DC
  Filled 2023-03-24: qty 90, 30d supply, fill #0
  Filled 2023-06-15: qty 90, 30d supply, fill #1

## 2022-09-30 MED ORDER — AZELAIC ACID 15 % EX GEL
1.0000 | Freq: Every day | CUTANEOUS | 0 refills | Status: DC
  Filled 2023-03-24: qty 50, 30d supply, fill #0

## 2022-09-30 MED ORDER — ESCITALOPRAM OXALATE 10 MG PO TABS
10.0000 mg | ORAL_TABLET | Freq: Every day | ORAL | 2 refills | Status: DC
  Filled 2022-10-17: qty 90, 90d supply, fill #0
  Filled 2023-03-24: qty 90, 90d supply, fill #1
  Filled 2023-06-15: qty 90, 90d supply, fill #2

## 2022-09-30 MED ORDER — SUMATRIPTAN SUCCINATE 100 MG PO TABS
100.0000 mg | ORAL_TABLET | ORAL | 11 refills | Status: DC
  Filled 2022-10-17: qty 9, 30d supply, fill #0
  Filled 2022-11-27: qty 9, 30d supply, fill #1
  Filled 2023-01-07: qty 9, 30d supply, fill #2
  Filled 2023-03-24: qty 9, 30d supply, fill #3
  Filled 2023-06-15: qty 9, 30d supply, fill #4
  Filled 2023-07-15: qty 9, 30d supply, fill #5
  Filled 2023-09-13: qty 9, 30d supply, fill #6

## 2022-09-30 MED ORDER — ERYTHROMYCIN 5 MG/GM OP OINT
TOPICAL_OINTMENT | OPHTHALMIC | 0 refills | Status: DC
  Filled 2022-09-30: qty 3.5, 7d supply, fill #0

## 2022-10-04 ENCOUNTER — Other Ambulatory Visit (HOSPITAL_COMMUNITY): Payer: Self-pay

## 2022-10-07 DIAGNOSIS — L570 Actinic keratosis: Secondary | ICD-10-CM | POA: Diagnosis not present

## 2022-10-18 ENCOUNTER — Other Ambulatory Visit (HOSPITAL_COMMUNITY): Payer: Self-pay

## 2022-10-18 ENCOUNTER — Other Ambulatory Visit: Payer: Self-pay

## 2022-11-29 ENCOUNTER — Other Ambulatory Visit (HOSPITAL_COMMUNITY): Payer: Self-pay

## 2023-01-09 ENCOUNTER — Other Ambulatory Visit: Payer: Self-pay

## 2023-01-09 ENCOUNTER — Other Ambulatory Visit (HOSPITAL_COMMUNITY): Payer: Self-pay

## 2023-03-24 ENCOUNTER — Other Ambulatory Visit: Payer: Self-pay

## 2023-03-24 ENCOUNTER — Other Ambulatory Visit (HOSPITAL_COMMUNITY): Payer: Self-pay

## 2023-03-25 ENCOUNTER — Other Ambulatory Visit: Payer: Self-pay

## 2023-03-27 ENCOUNTER — Other Ambulatory Visit: Payer: Self-pay

## 2023-03-27 ENCOUNTER — Other Ambulatory Visit (HOSPITAL_COMMUNITY): Payer: Self-pay

## 2023-03-28 ENCOUNTER — Other Ambulatory Visit: Payer: Self-pay

## 2023-06-10 DIAGNOSIS — J209 Acute bronchitis, unspecified: Secondary | ICD-10-CM | POA: Diagnosis not present

## 2023-06-10 DIAGNOSIS — Z6821 Body mass index (BMI) 21.0-21.9, adult: Secondary | ICD-10-CM | POA: Diagnosis not present

## 2023-06-10 DIAGNOSIS — J018 Other acute sinusitis: Secondary | ICD-10-CM | POA: Diagnosis not present

## 2023-06-15 ENCOUNTER — Other Ambulatory Visit (HOSPITAL_COMMUNITY): Payer: Self-pay

## 2023-06-16 ENCOUNTER — Other Ambulatory Visit (HOSPITAL_COMMUNITY): Payer: Self-pay

## 2023-06-16 ENCOUNTER — Other Ambulatory Visit: Payer: Self-pay

## 2023-06-16 ENCOUNTER — Other Ambulatory Visit (HOSPITAL_BASED_OUTPATIENT_CLINIC_OR_DEPARTMENT_OTHER): Payer: Self-pay

## 2023-06-16 MED ORDER — AZELAIC ACID 15 % EX GEL
1.0000 | Freq: Every morning | CUTANEOUS | 2 refills | Status: DC
  Filled 2023-06-16 – 2023-07-15 (×2): qty 50, 30d supply, fill #0

## 2023-06-16 MED ORDER — AZELAIC ACID 15 % EX GEL
CUTANEOUS | 2 refills | Status: DC
  Filled 2023-06-16 (×2): qty 50, 30d supply, fill #0
  Filled 2023-11-06: qty 50, 30d supply, fill #1
  Filled 2024-01-10: qty 50, 30d supply, fill #2

## 2023-07-03 DIAGNOSIS — Z1389 Encounter for screening for other disorder: Secondary | ICD-10-CM | POA: Diagnosis not present

## 2023-07-03 DIAGNOSIS — Z01419 Encounter for gynecological examination (general) (routine) without abnormal findings: Secondary | ICD-10-CM | POA: Diagnosis not present

## 2023-07-03 DIAGNOSIS — Z780 Asymptomatic menopausal state: Secondary | ICD-10-CM | POA: Diagnosis not present

## 2023-07-03 DIAGNOSIS — Z1231 Encounter for screening mammogram for malignant neoplasm of breast: Secondary | ICD-10-CM | POA: Diagnosis not present

## 2023-07-15 ENCOUNTER — Other Ambulatory Visit (HOSPITAL_COMMUNITY): Payer: Self-pay

## 2023-07-17 ENCOUNTER — Other Ambulatory Visit: Payer: Self-pay

## 2023-07-17 ENCOUNTER — Other Ambulatory Visit (HOSPITAL_COMMUNITY): Payer: Self-pay

## 2023-07-17 MED ORDER — DAPSONE 7.5 % EX GEL
1.0000 | Freq: Every evening | CUTANEOUS | 2 refills | Status: DC
  Filled 2023-07-17: qty 60, 30d supply, fill #0
  Filled 2023-11-06: qty 60, 30d supply, fill #2

## 2023-08-10 ENCOUNTER — Other Ambulatory Visit (HOSPITAL_BASED_OUTPATIENT_CLINIC_OR_DEPARTMENT_OTHER): Payer: Self-pay

## 2023-08-10 ENCOUNTER — Other Ambulatory Visit (HOSPITAL_COMMUNITY): Payer: Self-pay

## 2023-08-18 ENCOUNTER — Other Ambulatory Visit: Payer: Self-pay

## 2023-09-13 ENCOUNTER — Other Ambulatory Visit: Payer: Self-pay

## 2023-09-13 ENCOUNTER — Other Ambulatory Visit (HOSPITAL_COMMUNITY): Payer: Self-pay

## 2023-09-13 MED ORDER — ESCITALOPRAM OXALATE 10 MG PO TABS
10.0000 mg | ORAL_TABLET | Freq: Every day | ORAL | 0 refills | Status: DC
  Filled 2023-09-13 – 2023-09-20 (×2): qty 30, 30d supply, fill #0

## 2023-09-14 ENCOUNTER — Other Ambulatory Visit: Payer: Self-pay

## 2023-09-14 ENCOUNTER — Other Ambulatory Visit (HOSPITAL_COMMUNITY): Payer: Self-pay

## 2023-09-20 ENCOUNTER — Other Ambulatory Visit (HOSPITAL_COMMUNITY): Payer: Self-pay

## 2023-09-25 ENCOUNTER — Other Ambulatory Visit (HOSPITAL_COMMUNITY): Payer: Self-pay

## 2023-09-25 DIAGNOSIS — L700 Acne vulgaris: Secondary | ICD-10-CM | POA: Diagnosis not present

## 2023-09-25 MED ORDER — AZELAIC ACID 15 % EX GEL
Freq: Every day | CUTANEOUS | 3 refills | Status: AC
  Filled 2023-09-25 – 2024-02-26 (×2): qty 50, 30d supply, fill #0
  Filled 2024-05-29: qty 50, 30d supply, fill #1
  Filled 2024-06-20 – 2024-07-20 (×2): qty 50, 30d supply, fill #2

## 2023-09-25 MED ORDER — DAPSONE 7.5 % EX GEL
Freq: Every evening | CUTANEOUS | 3 refills | Status: DC
Start: 2023-09-25 — End: 2024-02-20
  Filled 2023-09-25 – 2024-01-10 (×2): qty 60, 30d supply, fill #0

## 2023-10-05 ENCOUNTER — Other Ambulatory Visit (HOSPITAL_COMMUNITY): Payer: Self-pay

## 2023-10-06 ENCOUNTER — Other Ambulatory Visit: Payer: Self-pay

## 2023-10-06 ENCOUNTER — Other Ambulatory Visit (HOSPITAL_COMMUNITY): Payer: Self-pay

## 2023-10-06 DIAGNOSIS — Z0000 Encounter for general adult medical examination without abnormal findings: Secondary | ICD-10-CM | POA: Diagnosis not present

## 2023-10-06 DIAGNOSIS — Z8639 Personal history of other endocrine, nutritional and metabolic disease: Secondary | ICD-10-CM | POA: Diagnosis not present

## 2023-10-06 DIAGNOSIS — Z1330 Encounter for screening examination for mental health and behavioral disorders, unspecified: Secondary | ICD-10-CM | POA: Diagnosis not present

## 2023-10-06 DIAGNOSIS — G43009 Migraine without aura, not intractable, without status migrainosus: Secondary | ICD-10-CM | POA: Diagnosis not present

## 2023-10-06 DIAGNOSIS — Z1331 Encounter for screening for depression: Secondary | ICD-10-CM | POA: Diagnosis not present

## 2023-10-06 DIAGNOSIS — Z131 Encounter for screening for diabetes mellitus: Secondary | ICD-10-CM | POA: Diagnosis not present

## 2023-10-06 DIAGNOSIS — E559 Vitamin D deficiency, unspecified: Secondary | ICD-10-CM | POA: Diagnosis not present

## 2023-10-06 DIAGNOSIS — Z13220 Encounter for screening for lipoid disorders: Secondary | ICD-10-CM | POA: Diagnosis not present

## 2023-10-06 MED ORDER — DAPSONE 7.5 % EX GEL
1.0000 | Freq: Every day | CUTANEOUS | 2 refills | Status: AC
  Filled 2023-10-06: qty 90, 30d supply, fill #0
  Filled 2024-02-26: qty 60, 30d supply, fill #0
  Filled 2024-05-29: qty 60, 30d supply, fill #1
  Filled 2024-07-20: qty 60, 30d supply, fill #2

## 2023-10-06 MED ORDER — SUMATRIPTAN SUCCINATE 100 MG PO TABS
100.0000 mg | ORAL_TABLET | ORAL | 11 refills | Status: AC | PRN
  Filled 2023-10-06 – 2023-11-06 (×2): qty 9, 30d supply, fill #0
  Filled 2024-01-10: qty 9, 30d supply, fill #1
  Filled 2024-02-26: qty 9, 30d supply, fill #2
  Filled 2024-05-29: qty 9, 30d supply, fill #3
  Filled 2024-06-20 – 2024-07-20 (×2): qty 9, 30d supply, fill #4

## 2023-10-06 MED ORDER — KETOROLAC TROMETHAMINE 60 MG/2ML IM SOLN
60.0000 mg | INTRAMUSCULAR | 0 refills | Status: AC | PRN
Start: 2023-10-06 — End: ?
  Filled 2023-10-06: qty 10, 5d supply, fill #0
  Filled 2023-11-06: qty 20, 10d supply, fill #0

## 2023-10-06 MED ORDER — ESCITALOPRAM OXALATE 10 MG PO TABS
10.0000 mg | ORAL_TABLET | Freq: Every day | ORAL | 3 refills | Status: AC
  Filled 2023-10-06 – 2023-11-06 (×2): qty 90, 90d supply, fill #0
  Filled 2024-01-10 – 2024-01-17 (×2): qty 90, 90d supply, fill #1
  Filled 2024-05-29: qty 90, 90d supply, fill #2
  Filled 2024-07-20: qty 90, 90d supply, fill #3

## 2023-10-13 ENCOUNTER — Other Ambulatory Visit: Payer: Self-pay

## 2023-10-13 ENCOUNTER — Ambulatory Visit (INDEPENDENT_AMBULATORY_CARE_PROVIDER_SITE_OTHER): Admitting: Family Medicine

## 2023-10-13 ENCOUNTER — Encounter: Payer: Self-pay | Admitting: Family Medicine

## 2023-10-13 VITALS — BP 126/77 | HR 70 | Ht 62.0 in | Wt 123.0 lb

## 2023-10-13 DIAGNOSIS — M25521 Pain in right elbow: Secondary | ICD-10-CM

## 2023-10-13 NOTE — Patient Instructions (Signed)
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
## 2023-10-17 ENCOUNTER — Encounter: Payer: Self-pay | Admitting: Family Medicine

## 2023-10-17 ENCOUNTER — Ambulatory Visit (INDEPENDENT_AMBULATORY_CARE_PROVIDER_SITE_OTHER): Payer: Self-pay | Admitting: Family Medicine

## 2023-10-17 DIAGNOSIS — M25521 Pain in right elbow: Secondary | ICD-10-CM

## 2023-10-17 NOTE — Patient Instructions (Signed)

## 2023-10-17 NOTE — Progress Notes (Signed)
 FOLLOW-UP VISIT:  Shockwave Therapy Procedure   NAME:  Kylie Lee DOB:  [birth year] MR#: [id number] DATE OF VISIT:  10/17/2023  Encounter:   Kylie Lee comes in for a follow up evaluation and 2nd Radial Extracorporeal Shockwave therapy (ESWT) treatment to the Rt distal bicep Ranie reports some improvement with last session Still waiting on [employer] sleeve - did purchase another sleeve that she has been wearing   ESWT Procedure Note: Treatment #2  Diagnosis: Rt distal bicep tendinopathy  Procedure Details  Consent for the procedure was obtained. Time out was performed. The anatomical landmarks and target areas for the procedure were identified.   PROCEDURE: ESWT was discussed with the patient in detail.  The risk and benefits were explained.  The point of maximal tenderness was identified and the oscillator probe was applied with moderate pressure. Treatment adjusted as mediated by patient feedback/pain.  Rt distal bicep was targeted for ESWT  Preset: post muscle injury Power level: 100 MJ Frequency: 12 Hz Impulses: 2500 Head size: large   Complications:  None; patient tolerated the procedure well.  Assessment & Plan Right elbow pain Rt elbow pain with distal bicep tendinopathy   Plan:   1. Return in 1 week for ESWT procedure - she is aware I will be away next week, will schedule with one of my colleagues 2. Procedure aftercare reviewed 3. Recommended avoiding strenuous activities and using OTC analgesia prn.

## 2023-10-19 ENCOUNTER — Other Ambulatory Visit (HOSPITAL_COMMUNITY): Payer: Self-pay

## 2023-10-20 DIAGNOSIS — M25521 Pain in right elbow: Secondary | ICD-10-CM | POA: Diagnosis not present

## 2023-10-23 DIAGNOSIS — M25521 Pain in right elbow: Secondary | ICD-10-CM | POA: Diagnosis not present

## 2023-10-24 ENCOUNTER — Ambulatory Visit (INDEPENDENT_AMBULATORY_CARE_PROVIDER_SITE_OTHER): Payer: Self-pay | Admitting: Family Medicine

## 2023-10-24 ENCOUNTER — Encounter: Payer: Self-pay | Admitting: Family Medicine

## 2023-10-24 DIAGNOSIS — M25521 Pain in right elbow: Secondary | ICD-10-CM

## 2023-10-24 NOTE — Progress Notes (Signed)
 FOLLOW-UP VISIT:  Shockwave Therapy Procedure    NAME:  Kylie Lee DOB:  [birth year] MR#: [id number] DATE OF VISIT:  10/24/2023   Encounter:   Kylie Lee comes in for a follow up evaluation and 3nd Radial Extracorporeal Shockwave therapy (ESWT) treatment to the Rt distal bicep Patient notes approximately 20% improvement over the past 1 week.  Patient is also been doing physical therapy/dry needling and notes some improvement with that as well.  Given patient's improvement, we will continue with shockwave therapy as this is #3   ESWT Procedure Note: Treatment #3   Diagnosis: Rt distal bicep tendinopathy   Procedure Details  Consent for the procedure was obtained. Time out was performed. The anatomical landmarks and target areas for the procedure were identified.    PROCEDURE: ESWT was discussed with the patient in detail.  The risk and benefits were explained.  The point of maximal tenderness was identified and the oscillator probe was applied with moderate pressure. Treatment adjusted as mediated by patient feedback/pain.   Rt distal bicep was targeted for ESWT   Preset: post muscle injury Power level: 120 MJ Frequency: 12 Hz Impulses: 3000 Head size: large     Complications:  None; patient tolerated the procedure well.   Assessment & Plan Right elbow pain Rt elbow pain with distal bicep tendinopathy    Plan:   1. Return in 1 week for ESWT procedure -patient is been tolerating procedures well and does note some improvement.  Will continue with physical therapy as well as shockwave treatment.  This is therapy #3, plan for anywhere between 4-6 depending on improvement. 2. Procedure aftercare reviewed 3. Recommended avoiding strenuous activities and using OTC analgesia prn.

## 2023-10-27 DIAGNOSIS — M25521 Pain in right elbow: Secondary | ICD-10-CM | POA: Diagnosis not present

## 2023-10-31 ENCOUNTER — Encounter: Payer: Self-pay | Admitting: Family Medicine

## 2023-10-31 ENCOUNTER — Ambulatory Visit (INDEPENDENT_AMBULATORY_CARE_PROVIDER_SITE_OTHER): Payer: Self-pay | Admitting: Family Medicine

## 2023-10-31 DIAGNOSIS — M25521 Pain in right elbow: Secondary | ICD-10-CM | POA: Diagnosis not present

## 2023-10-31 NOTE — Patient Instructions (Signed)

## 2023-10-31 NOTE — Progress Notes (Signed)
 FOLLOW-UP VISIT:  Shockwave Therapy Procedure   NAME:  Kylie Lee DOB:  1981/03/13 MR#: [id number] DATE OF VISIT:  10/31/2023  Encounter:   Kylie Lee comes in for a follow up evaluation and 4th Radial Extracorporeal Shockwave therapy (ESWT) treatment to the  RT distal bicep Kylie Lee reports pain has been improving Has been doing PT Able to lift purse without pain Has not tried any other heavy lifting     ESWT Procedure Note: Treatment #4  Diagnosis: Rt distal bicep tendinopathy  Procedure Details  Consent for the procedure was obtained. Time out was performed. The anatomical landmarks and target areas for the procedure were identified.   PROCEDURE: ESWT was discussed with the patient in detail.  The risk and benefits were explained.  The point of maximal tenderness was identified and the oscillator probe was applied with moderate pressure. Treatment adjusted as mediated by patient feedback/pain.  Rt distal bicep  was targeted for ESWT  Preset: post muscle injury Power level: 120 MJ Frequency: 14 Hz Impulses: 2500 Head size: large   Complications:  None; patient tolerated the procedure well.  Assessment & Plan Right elbow pain Rt elbow pain with distal bicep tendinopathy - improving, completed 4th ESWT tx today  Plan:   1. Discussed ongoing treatment.  Would be ok to focus on PT or could consider return in 2 week for ESWT procedure depending on how she feels 2. Procedure aftercare reviewed 3. Recommended avoiding strenuous activities and using OTC analgesia prn.

## 2023-11-03 DIAGNOSIS — M25521 Pain in right elbow: Secondary | ICD-10-CM | POA: Diagnosis not present

## 2023-11-06 ENCOUNTER — Other Ambulatory Visit: Payer: Self-pay

## 2023-11-06 ENCOUNTER — Other Ambulatory Visit (HOSPITAL_COMMUNITY): Payer: Self-pay

## 2023-11-07 ENCOUNTER — Ambulatory Visit: Payer: Self-pay | Admitting: Family Medicine

## 2023-11-07 ENCOUNTER — Other Ambulatory Visit (HOSPITAL_COMMUNITY): Payer: Self-pay

## 2023-11-07 DIAGNOSIS — M25521 Pain in right elbow: Secondary | ICD-10-CM | POA: Diagnosis not present

## 2023-11-10 DIAGNOSIS — M25521 Pain in right elbow: Secondary | ICD-10-CM | POA: Diagnosis not present

## 2023-11-14 DIAGNOSIS — M25521 Pain in right elbow: Secondary | ICD-10-CM | POA: Diagnosis not present

## 2023-11-16 DIAGNOSIS — M25521 Pain in right elbow: Secondary | ICD-10-CM | POA: Diagnosis not present

## 2023-12-26 DIAGNOSIS — D225 Melanocytic nevi of trunk: Secondary | ICD-10-CM | POA: Diagnosis not present

## 2023-12-26 DIAGNOSIS — D485 Neoplasm of uncertain behavior of skin: Secondary | ICD-10-CM | POA: Diagnosis not present

## 2024-01-10 ENCOUNTER — Other Ambulatory Visit (HOSPITAL_COMMUNITY): Payer: Self-pay

## 2024-01-10 ENCOUNTER — Other Ambulatory Visit: Payer: Self-pay

## 2024-01-11 ENCOUNTER — Other Ambulatory Visit (HOSPITAL_COMMUNITY): Payer: Self-pay

## 2024-01-11 ENCOUNTER — Other Ambulatory Visit: Payer: Self-pay

## 2024-02-13 ENCOUNTER — Encounter: Payer: Self-pay | Admitting: Family Medicine

## 2024-02-13 ENCOUNTER — Ambulatory Visit (INDEPENDENT_AMBULATORY_CARE_PROVIDER_SITE_OTHER): Payer: Self-pay | Admitting: Family Medicine

## 2024-02-13 DIAGNOSIS — M25521 Pain in right elbow: Secondary | ICD-10-CM

## 2024-02-13 NOTE — Patient Instructions (Signed)

## 2024-02-13 NOTE — Progress Notes (Signed)
 FOLLOW-UP VISIT:  Shockwave Therapy Procedure   NAME:  Kylie Lee DOB:  11-03-1980 MR#: [id number] DATE OF VISIT:  02/13/2024  Encounter:   Stavroula comes in for a follow up evaluation and 1st Radial Extracorporeal Shockwave therapy (ESWT) treatment to the  Rt distal bicep.  Previous Rt distal bicep tendinopathy and treated with 4 sessions of ESWT in March, last 10/31/23.  Also did PT Acute exacerbation over the weekend, was startled by her son and tensed up her arm Using elbow sleeve   ESWT Procedure Note: Treatment #1  Diagnosis: Rt elbow pain with distal bicep pain  Procedure Details  Consent for the procedure was obtained. Time out was performed. The anatomical landmarks and target areas for the procedure were identified.   PROCEDURE: ESWT was discussed with the patient in detail.  The risk and benefits were explained.  The point of maximal tenderness was identified and the oscillator probe was applied with moderate pressure. Treatment adjusted as mediated by patient feedback/pain.  Rt distal bicep was targeted for ESWT  Preset: post muscle injury Power level: 120 MJ Frequency: 14 Hz Impulses: 2500 Head size: large   Complications:  None; patient tolerated the procedure well.  ASSESSMENT/PLAN: Rt elbow pain  Plan:   ESWT completed today as noted above Return in 1 week for ESWT procedure #2 Procedure aftercare reviewed Recommended avoiding strenuous activities and using OTC analgesia prn. Restart HEP from PT Cont elbow sleeve prn

## 2024-02-20 ENCOUNTER — Ambulatory Visit (INDEPENDENT_AMBULATORY_CARE_PROVIDER_SITE_OTHER): Payer: Self-pay | Admitting: Family Medicine

## 2024-02-20 ENCOUNTER — Encounter: Payer: Self-pay | Admitting: Family Medicine

## 2024-02-20 DIAGNOSIS — M25521 Pain in right elbow: Secondary | ICD-10-CM

## 2024-02-20 NOTE — Patient Instructions (Signed)

## 2024-02-20 NOTE — Progress Notes (Signed)
 FOLLOW-UP VISIT:  Shockwave Therapy Procedure   NAME:  Kylie Lee DOB:  09-Dec-1980 MR#: [id number] DATE OF VISIT:  02/20/2024  Encounter:   Kylie Lee comes in for a follow up evaluation and 2nd Radial Extracorporeal Shockwave therapy (ESWT) treatment to the  Rt distal bicep.  Kylie Lee reports some improvement with last session, but still some pain along medial bicep and along distal tendon at the elbow    ESWT Procedure Note: Treatment #2  Diagnosis: Right distal bicep pain  Procedure Details  Consent for the procedure was obtained. Time out was performed. The anatomical landmarks and target areas for the procedure were identified.   PROCEDURE: ESWT was discussed with the patient in detail.  The risk and benefits were explained.  The point of maximal tenderness was identified and the oscillator probe was applied with moderate pressure. Treatment adjusted as mediated by patient feedback/pain.  Rt distal bicep was targeted for ESWT  Preset: post muscle injury Power level: 120 MJ Frequency: 16 Hz Impulses: 2500 Head size: large   Complications:  None; patient tolerated the procedure well.  ASSESSMENT/PLAN: Rt elbow pain - right elbow pain with distal bicep pain  Plan:   ESWT completed today as noted above Return in 1 week for ESWT procedure #3 Procedure aftercare reviewed Recommended avoiding strenuous activities and using OTC analgesia prn.

## 2024-02-26 ENCOUNTER — Other Ambulatory Visit (HOSPITAL_COMMUNITY): Payer: Self-pay

## 2024-02-29 ENCOUNTER — Ambulatory Visit (INDEPENDENT_AMBULATORY_CARE_PROVIDER_SITE_OTHER): Payer: Self-pay | Admitting: Family Medicine

## 2024-02-29 ENCOUNTER — Encounter: Payer: Self-pay | Admitting: Family Medicine

## 2024-02-29 VITALS — Ht 62.0 in | Wt 123.0 lb

## 2024-02-29 DIAGNOSIS — M25521 Pain in right elbow: Secondary | ICD-10-CM

## 2024-02-29 NOTE — Patient Instructions (Signed)

## 2024-02-29 NOTE — Progress Notes (Signed)
 FOLLOW-UP VISIT:  Shockwave Therapy Procedure   NAME:  Kylie Lee DOB:  [birth year] MR#: [id number] DATE OF VISIT:  02/29/2024  Encounter:   Kylie Lee comes in for a follow up evaluation and 3rd Radial Extracorporeal Shockwave therapy (ESWT) treatment to the  Rt distal bicep.  Kylie Lee reports improvement overall since last treatment.  Feeling about 65-70% improved overall. Had some discomfort after last session   ESWT Procedure Note: Treatment #3  Diagnosis: Rt distal bicep pain  Procedure Details  Consent for the procedure was obtained. Time out was performed. The anatomical landmarks and target areas for the procedure were identified.   PROCEDURE: ESWT was discussed with the patient in detail.  The risk and benefits were explained.  The point of maximal tenderness was identified and the oscillator probe was applied with moderate pressure. Treatment adjusted as mediated by patient feedback/pain.  Rt distal bicep was targeted for ESWT  Preset: post muscle injury Power level: 110 MJ Frequency: 14 Hz Impulses: 2500 Head size: large   Complications:  None; patient tolerated the procedure well.  ASSESSMENT/PLAN: Rt elbow pain - right elbow pain with distal bicep pain  Plan:   ESWT completed today as noted above Return in 1 week for ESWT procedure #4 Procedure aftercare reviewed Recommended avoiding strenuous activities and using OTC analgesia prn.

## 2024-03-05 ENCOUNTER — Encounter: Payer: Self-pay | Admitting: Family Medicine

## 2024-03-05 ENCOUNTER — Ambulatory Visit (INDEPENDENT_AMBULATORY_CARE_PROVIDER_SITE_OTHER): Payer: Self-pay | Admitting: Family Medicine

## 2024-03-05 DIAGNOSIS — M25521 Pain in right elbow: Secondary | ICD-10-CM

## 2024-03-05 NOTE — Progress Notes (Signed)
 FOLLOW-UP VISIT:  Shockwave Therapy Procedure   NAME:  Kylie Lee DOB:  Jun 15, 1981 MR#: [id number] DATE OF VISIT:  03/05/2024  Encounter:   Annice comes in for a follow up evaluation and 4th Radial Extracorporeal Shockwave therapy (ESWT) treatment to the  Rt distal bicep.  Odalys reports slight interval change in symptoms since last visit   ESWT Procedure Note: Treatment #4  Diagnosis: Rt distal bicep pain  Procedure Details  Consent for the procedure was obtained. Time out was performed. The anatomical landmarks and target areas for the procedure were identified.   PROCEDURE: ESWT was discussed with the patient in detail.  The risk and benefits were explained.  The point of maximal tenderness was identified and the oscillator probe was applied with moderate pressure. Treatment adjusted as mediated by patient feedback/pain.  Rt distal bicep was targeted for ESWT  Preset: post muscle injury Power level: 110 MJ Frequency: 14 Hz Impulses: 3000 Head size: large   Complications:  None; patient tolerated the procedure well.  ASSESSMENT/PLAN: Rt elbow pain - right elbow pain with distal bicep pain  Plan:   ESWT completed today as noted above Return in 1 week for ESWT procedure #5 Procedure aftercare reviewed Recommended avoiding strenuous activities and using OTC analgesia prn.

## 2024-03-05 NOTE — Patient Instructions (Signed)

## 2024-03-12 ENCOUNTER — Ambulatory Visit: Payer: Self-pay | Admitting: Family Medicine

## 2024-04-25 DIAGNOSIS — E782 Mixed hyperlipidemia: Secondary | ICD-10-CM | POA: Diagnosis not present

## 2024-04-25 DIAGNOSIS — Z13220 Encounter for screening for lipoid disorders: Secondary | ICD-10-CM | POA: Diagnosis not present

## 2024-05-29 ENCOUNTER — Other Ambulatory Visit: Payer: Self-pay

## 2024-05-29 ENCOUNTER — Other Ambulatory Visit (HOSPITAL_COMMUNITY): Payer: Self-pay

## 2024-05-30 ENCOUNTER — Other Ambulatory Visit: Payer: Self-pay

## 2024-06-04 ENCOUNTER — Other Ambulatory Visit (HOSPITAL_COMMUNITY): Payer: Self-pay

## 2024-06-04 DIAGNOSIS — D225 Melanocytic nevi of trunk: Secondary | ICD-10-CM | POA: Diagnosis not present

## 2024-06-04 DIAGNOSIS — D2262 Melanocytic nevi of left upper limb, including shoulder: Secondary | ICD-10-CM | POA: Diagnosis not present

## 2024-06-04 DIAGNOSIS — D2261 Melanocytic nevi of right upper limb, including shoulder: Secondary | ICD-10-CM | POA: Diagnosis not present

## 2024-06-04 DIAGNOSIS — D2272 Melanocytic nevi of left lower limb, including hip: Secondary | ICD-10-CM | POA: Diagnosis not present

## 2024-06-04 DIAGNOSIS — L700 Acne vulgaris: Secondary | ICD-10-CM | POA: Diagnosis not present

## 2024-06-04 MED ORDER — TRETINOIN 0.05 % EX CREA
1.0000 | TOPICAL_CREAM | Freq: Every evening | CUTANEOUS | 3 refills | Status: AC
  Filled 2024-06-04: qty 45, 90d supply, fill #0
  Filled 2024-06-20: qty 45, 30d supply, fill #0
  Filled 2024-07-20: qty 45, 30d supply, fill #1

## 2024-06-04 MED ORDER — AZELAIC ACID 15 % EX GEL
1.0000 | Freq: Every morning | CUTANEOUS | 3 refills | Status: AC
Start: 2024-06-04 — End: ?

## 2024-06-04 MED ORDER — DAPSONE 7.5 % EX GEL: 1.0000 | Freq: Every evening | CUTANEOUS | 3 refills | Status: AC

## 2024-06-13 ENCOUNTER — Other Ambulatory Visit (HOSPITAL_COMMUNITY): Payer: Self-pay

## 2024-06-21 ENCOUNTER — Other Ambulatory Visit: Payer: Self-pay

## 2024-06-21 ENCOUNTER — Other Ambulatory Visit (HOSPITAL_BASED_OUTPATIENT_CLINIC_OR_DEPARTMENT_OTHER): Payer: Self-pay

## 2024-07-10 DIAGNOSIS — Z1151 Encounter for screening for human papillomavirus (HPV): Secondary | ICD-10-CM | POA: Diagnosis not present

## 2024-07-10 DIAGNOSIS — Z1231 Encounter for screening mammogram for malignant neoplasm of breast: Secondary | ICD-10-CM | POA: Diagnosis not present

## 2024-07-10 DIAGNOSIS — Z130 Encounter for screening for diseases of the blood and blood-forming organs and certain disorders involving the immune mechanism: Secondary | ICD-10-CM | POA: Diagnosis not present

## 2024-07-10 DIAGNOSIS — Z1389 Encounter for screening for other disorder: Secondary | ICD-10-CM | POA: Diagnosis not present

## 2024-07-10 DIAGNOSIS — Z124 Encounter for screening for malignant neoplasm of cervix: Secondary | ICD-10-CM | POA: Diagnosis not present

## 2024-07-10 DIAGNOSIS — Z01419 Encounter for gynecological examination (general) (routine) without abnormal findings: Secondary | ICD-10-CM | POA: Diagnosis not present

## 2024-07-20 ENCOUNTER — Other Ambulatory Visit (HOSPITAL_COMMUNITY): Payer: Self-pay

## 2024-07-22 ENCOUNTER — Other Ambulatory Visit: Payer: Self-pay

## 2024-08-12 ENCOUNTER — Other Ambulatory Visit (HOSPITAL_COMMUNITY): Payer: Self-pay

## 2024-08-12 ENCOUNTER — Encounter (HOSPITAL_COMMUNITY): Payer: Self-pay

## 2024-08-16 ENCOUNTER — Other Ambulatory Visit (HOSPITAL_COMMUNITY): Payer: Self-pay
